# Patient Record
Sex: Male | Born: 1995 | Race: Black or African American | Hispanic: No | Marital: Single | Smoking: Current every day smoker
Health system: Southern US, Community
[De-identification: ages and names within clinical notes are randomized; demographics above are authoritative.]

## PROBLEM LIST (undated history)

## (undated) DIAGNOSIS — J45909 Unspecified asthma, uncomplicated: Secondary | ICD-10-CM

## (undated) HISTORY — PX: TONSILLECTOMY: SUR1361

---
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
## 2017-01-07 ENCOUNTER — Emergency Department
Admission: EM | Admit: 2017-01-07 | Discharge: 2017-01-07 | Disposition: A | Discharge status: Home / Self Care | Payer: Medicaid Other | Attending: Emergency Medicine | Admitting: Emergency Medicine

## 2017-01-07 ENCOUNTER — Encounter: Payer: Self-pay | Admitting: Medical Oncology

## 2017-01-07 DIAGNOSIS — Y939 Activity, unspecified: Secondary | ICD-10-CM | POA: Insufficient documentation

## 2017-01-07 DIAGNOSIS — J45909 Unspecified asthma, uncomplicated: Secondary | ICD-10-CM | POA: Insufficient documentation

## 2017-01-07 DIAGNOSIS — F17210 Nicotine dependence, cigarettes, uncomplicated: Secondary | ICD-10-CM | POA: Insufficient documentation

## 2017-01-07 DIAGNOSIS — Y929 Unspecified place or not applicable: Secondary | ICD-10-CM | POA: Insufficient documentation

## 2017-01-07 DIAGNOSIS — W25XXXA Contact with sharp glass, initial encounter: Secondary | ICD-10-CM | POA: Diagnosis not present

## 2017-01-07 DIAGNOSIS — S61215A Laceration without foreign body of left ring finger without damage to nail, initial encounter: Secondary | ICD-10-CM

## 2017-01-07 DIAGNOSIS — Y999 Unspecified external cause status: Secondary | ICD-10-CM | POA: Insufficient documentation

## 2017-01-07 DIAGNOSIS — S61216A Laceration without foreign body of right little finger without damage to nail, initial encounter: Secondary | ICD-10-CM | POA: Diagnosis not present

## 2017-01-07 DIAGNOSIS — Z23 Encounter for immunization: Secondary | ICD-10-CM | POA: Diagnosis not present

## 2017-01-07 MED ORDER — TETANUS-DIPHTH-ACELL PERTUSSIS 5-2.5-18.5 LF-MCG/0.5 IM SUSP
0.5000 mL | Freq: Once | INTRAMUSCULAR | Status: AC
  Administered 2017-01-07: 0.5 mL via INTRAMUSCULAR
  Filled 2017-01-07: qty 0.5

## 2017-01-07 NOTE — ED Provider Notes (Signed)
[facility] Emergency Department Provider Note   ____________________________________________   I have reviewed the triage vital signs and the nursing notes.   HISTORY  Chief Complaint Laceration    HPI Ronnie Garcia is a 21 y.o. male presents to the emergency department with a laceration along the lateral aspect of the right Ronnie Garcia finger. Patient reports lacerating the finger with a piece of broken glass earlier this evening. Patient reports the glass was not clean and he does not know when his last tetanus was updated. Patient is attempted to maintain hemorrhage control however during the laceration skin flap was completely lacerated from the finger edge. Patient reports intact movement and sensation of the right Ronnie Garcia finger. Patient denies fever, chills, headache, vision changes, chest pain, chest tightness, shortness of breath, abdominal pain, nausea and vomiting.  Past Medical History:  Diagnosis Date  . Asthma     There are no active problems to display for this patient.   Past Surgical History:  Procedure Laterality Date  . TONSILLECTOMY      Prior to Admission medications   Not on File    Allergies Patient has no known allergies.  No family history on file.  Social History Social History  Substance Use Topics  . Smoking status: Current Every Day Smoker  . Smokeless tobacco: Not on file  . Alcohol use Yes    Review of Systems Constitutional: Negative for fever/chills Eyes: No visual changes. ENT:  Negative for sore throat and for difficulty swallowing Cardiovascular: Denies chest pain. Respiratory: Denies cough. Denies shortness of breath. Musculoskeletal: Negative for back pain. Skin: Negative for rash. Laceration along the right [place] finger with complete removal of skin flap. Neurological: Negative for headaches.  ____________________________________________   PHYSICAL EXAM:  VITAL SIGNS: ED Triage Vitals  Enc  Vitals Group     BP 01/07/17 1845 112/68     Pulse Rate 01/07/17 1845 93     Resp 01/07/17 1845 16     Temp 01/07/17 1845 98.7 F (37.1 C)     Temp Source 01/07/17 1845 Oral     SpO2 01/07/17 1845 97 %     Weight 01/07/17 1843 167 lb (75.8 kg)     Height 01/07/17 1843 6\' 2"  (1.88 m)     Head Circumference --      Peak Flow --      Pain Score 01/07/17 1842 0     Pain Loc --      Pain Edu? --      Excl. in [place]? --     Constitutional: Alert and oriented. Well appearing and in no acute distress.  Eyes: Conjunctivae are normal. PERRL. EOMI  Head: Normocephalic and atraumatic. ENT:      Ears: Canals clear. TMs intact bilaterally.      Nose: No congestion/rhinnorhea.      Mouth/Throat: Mucous membranes are moist. Neck:Supple. No thyromegaly. No stridor.  Cardiovascular: Normal rate, regular rhythm. Respiratory: Normal respiratory effort without tachypnea or retractions. Lungs CTAB. No wheezes/rales/rhonchi. Hematological/Lymphatic/Immunological: No cervical lymphadenopathy. Cardiovascular: Normal rate, regular rhythm. Normal distal pulses. Gastrointestinal: Bowel sounds 4 quadrants. Soft and nontender to palpation. Musculoskeletal: Right Ronnie Garcia finger movement and sensation intact. Skin:  Skin is warm, dry and intact. No rash noted. Approximately 2 cm x 0.75 cm superficial laceration along the lateral aspect of the right Ronnie Garcia finger with skin flaps removed. Adipose tissue exposed with hemorrhage controlled. Psychiatric: Mood and affect are normal. Speech and behavior are normal. Patient exhibits  appropriate insight and judgement.  ____________________________________________   LABS (all labs ordered are listed, but only abnormal results are displayed)  Labs Reviewed - No data to display ____________________________________________  EKG None ____________________________________________  RADIOLOGY None ____________________________________________   PROCEDURES  Procedure(s)  performed: LACERATION REPAIR Performed by: Jerolyn Shin Authorized by: Jerolyn Shin Consent: Verbal consent obtained. Risks and benefits: risks, benefits and alternatives were discussed Consent given by: patient Patient identity confirmed: provided demographic data Prepped and Draped in normal sterile fashion Wound explored  Laceration Location: Lateral aspect of the right Ronnie Garcia finger.  Laceration Length: 2.0 cm x 0.75 cm superficial  No Foreign Bodies seen or palpated  Irrigation method: Normal saline with gauze  Amount of cleaning: standard  Skin closure: Surgicel placed in the wound bed then dressed with 2 x 2 and covered with rolled gauze.  Patient tolerance: Patient tolerated the procedure well with no immediate complications.   Critical Care performed: no ____________________________________________   INITIAL IMPRESSION / ASSESSMENT AND PLAN / ED COURSE  Pertinent labs & imaging results that were available during my care of the patient were reviewed by me and considered in my medical decision making (see chart for details).   Patient sustained a laceration to the lateral aspect of the right Ronnie Garcia finger.  Assessment confirmed movement and sensation of the digit before and after wound closure. Laceration required closure as noted above. Patient tolerated procedure well. Pt instructed to keep wound clean, dry and covered in dirty, dusty or wet environments. Patient also instructed to watch for signs of infection and return if changes are noted. Patient informed of clinical course, understand medical decision-making process, and agree with plan.  Patient was advised to follow up with PCP as needed and was also advised to return to the emergency department for symptoms that change or worsen.       ____________________________________________   FINAL CLINICAL IMPRESSION(S) / ED DIAGNOSES  Final diagnoses:  Laceration of left ring finger without foreign body  without damage to nail, initial encounter       NEW MEDICATIONS STARTED DURING THIS VISIT:  New Prescriptions   No medications on file     Note:  This document was prepared using Dragon voice recognition software and may include unintentional dictation errors.    Jerolyn Shin, PA-C 01/07/17 2015    Schuyler Amor, MD 01/07/17 [phone]

## 2017-01-07 NOTE — ED Notes (Signed)
Pt. Verbalizes understanding of d/c instructions and follow-up. VS stable and pain controlled per pt.  Pt. In NAD at time of d/c and denies further concerns regarding this visit. Pt. Stable at the time of departure from the unit, departing unit by the safest and most appropriate manner per that pt condition and limitations. Pt advised to return to the ED at any time for emergent concerns, or for new/worsening symptoms.   

## 2017-01-07 NOTE — ED Triage Notes (Signed)
Pt reports cutting rt hand pinky finger with broken glass. Tetanus is NOT UTD.

## 2017-01-07 NOTE — Discharge Instructions (Signed)
Keep the wound clean and dry and covered when and dirty dusty or wet environment. Do not remove the wound care material in the base of the wound allow it to fall off naturally.  Continue to monitor the wound area for signs of infection. If he noticed developing infection do not hesitate to return to the emergency department for continued care.

## 2017-09-12 ENCOUNTER — Other Ambulatory Visit: Payer: Self-pay

## 2017-09-12 ENCOUNTER — Emergency Department
Admission: EM | Admit: 2017-09-12 | Discharge: 2017-09-12 | Disposition: A | Discharge status: Home / Self Care | Payer: Medicaid Other | Attending: Emergency Medicine | Admitting: Emergency Medicine

## 2017-09-12 DIAGNOSIS — R369 Urethral discharge, unspecified: Secondary | ICD-10-CM

## 2017-09-12 DIAGNOSIS — A549 Gonococcal infection, unspecified: Secondary | ICD-10-CM | POA: Insufficient documentation

## 2017-09-12 DIAGNOSIS — F17200 Nicotine dependence, unspecified, uncomplicated: Secondary | ICD-10-CM | POA: Insufficient documentation

## 2017-09-12 DIAGNOSIS — J45909 Unspecified asthma, uncomplicated: Secondary | ICD-10-CM | POA: Insufficient documentation

## 2017-09-12 LAB — URINALYSIS, COMPLETE (UACMP) WITH MICROSCOPIC
Bacteria, UA: NONE SEEN
Bilirubin Urine: NEGATIVE
GLUCOSE, UA: NEGATIVE mg/dL
Hgb urine dipstick: NEGATIVE
Ketones, ur: NEGATIVE mg/dL
Nitrite: NEGATIVE
PROTEIN: NEGATIVE mg/dL
Specific Gravity, Urine: 1.02 (ref 1.005–1.030)
WBC, UA: >50 WBC/hpf — ABNORMAL HIGH (ref 0–5)
pH: 7 (ref 5.0–8.0)

## 2017-09-12 LAB — CHLAMYDIA/NGC RT PCR (ARMC ONLY)
CHLAMYDIA TR: NOT DETECTED
N gonorrhoeae: DETECTED — AB

## 2017-09-12 MED ORDER — AZITHROMYCIN 500 MG PO TABS
1000.0000 mg | ORAL_TABLET | ORAL | Status: AC
  Administered 2017-09-12: 1000 mg via ORAL
  Filled 2017-09-12: qty 2

## 2017-09-12 MED ORDER — LIDOCAINE HCL (PF) 1 % IJ SOLN
INTRAMUSCULAR | Status: AC
  Administered 2017-09-12: 5 mL
  Filled 2017-09-12: qty 5

## 2017-09-12 MED ORDER — CEFTRIAXONE SODIUM 250 MG IJ SOLR
250.0000 mg | Freq: Once | INTRAMUSCULAR | Status: AC
  Administered 2017-09-12: 250 mg via INTRAMUSCULAR
  Filled 2017-09-12: qty 250

## 2017-09-12 NOTE — ED Triage Notes (Signed)
Pain with urination, swelling to penis, bloody d/c.

## 2017-09-12 NOTE — ED Notes (Signed)
Pt reports pain during urination, describes purulent discharge from penis for the last 2 days, pt with significant other, reports only one sexual partner, denies hx of STI, no med or surgical hx

## 2017-09-12 NOTE — ED Provider Notes (Signed)
[facility] Emergency Department Provider Note  ____________________________________________   First MD Initiated Contact with Patient 09/12/17 [phone]     (approximate)  I have reviewed the triage vital signs and the nursing notes.   HISTORY  Chief Complaint Exposure to STD    HPI Ronnie Garcia is a 22 y.o. male with no past medical history presents for evaluation of about 24 hours of thick white penile discharge that is painful.  He reports that it is painful when he urinates as well.  He had no symptoms previously so this was acute in onset and he describes it as severe.  He admits to multiple sexual partners.  He presents tonight as well as his current girlfriend who is pregnant.  He is worried about having a sexual transmitted disease that he could have also given to her.  He denies any other symptoms, specifically including fever/chills, chest pain, shortness of breath, nausea, vomiting, abdominal pain.  Past Medical History:  Diagnosis Date  . Asthma     There are no active problems to display for this patient.   Past Surgical History:  Procedure Laterality Date  . TONSILLECTOMY      Prior to Admission medications   Not on File    Allergies Patient has no known allergies.  No family history on file.  Social History Social History   Tobacco Use  . Smoking status: Current Every Day Smoker  . Smokeless tobacco: Never Used  Substance Use Topics  . Alcohol use: Yes  . Drug use: Yes    Types: Marijuana    Review of Systems Constitutional: No fever/chills Cardiovascular: Denies chest pain. Respiratory: Denies shortness of breath. Gastrointestinal: No abdominal pain.  No nausea, no vomiting.   Genitourinary: Positive for penile discharge and dysuria Integumentary: Negative for rash.  ____________________________________________   PHYSICAL EXAM:  VITAL SIGNS: ED Triage Vitals  Enc Vitals Group     BP 09/12/17 0024 116/75   Pulse Rate 09/12/17 0024 74     Resp 09/12/17 0024 18     Temp 09/12/17 0024 98.4 F (36.9 C)     Temp Source 09/12/17 0024 Oral     SpO2 09/12/17 0024 97 %     Weight 09/12/17 0025 76.7 kg (169 lb)     Height 09/12/17 0025 1.88 m (6\' 2" )     Head Circumference --      Peak Flow --      Pain Score 09/12/17 0025 2     Pain Loc --      Pain Edu? --      Excl. in [place]? --     Constitutional: Alert and oriented. Well appearing and in no acute distress. Eyes: Conjunctivae are normal.  Head: Atraumatic. Cardiovascular: Normal rate, regular rhythm. Good peripheral circulation.  Respiratory: Normal respiratory effort.  No retractions.  Gastrointestinal: Soft and nontender. No distention.  Genitourinary: Normal external male genitalia.  No tenderness to palpation of the penis and no penile lesions.  Purulent discharge from the urethra.  No tenderness to palpation of the testicles or scrotum.  No scrotal edema. Neurologic:  Normal speech and language. No gross focal neurologic deficits are appreciated.  Skin:  Skin is warm, dry and intact. No rash noted. Psychiatric: Mood and affect are normal. Speech and behavior are normal.  ____________________________________________   LABS (all labs ordered are listed, but only abnormal results are displayed)  Labs Reviewed  CHLAMYDIA/NGC RT PCR ([facility] ONLY) - Abnormal; Notable for the  following components:      Result Value   N gonorrhoeae DETECTED (*)    All other components within normal limits  URINALYSIS, COMPLETE (UACMP) WITH MICROSCOPIC - Abnormal; Notable for the following components:   Color, Urine YELLOW (*)    APPearance CLOUDY (*)    Leukocytes, UA LARGE (*)    WBC, UA >50 (*)    All other components within normal limits  URINE CULTURE   ____________________________________________  EKG  No indication for EKG ____________________________________________  RADIOLOGY   ED MD interpretation: No indication for  imaging  Official radiology report(s): No results found.  ____________________________________________   PROCEDURES  Critical Care performed: No   Procedure(s) performed:   Procedures   ____________________________________________   INITIAL IMPRESSION / ASSESSMENT AND PLAN / ED COURSE  As part of my medical decision making, I reviewed the following data within the [facility] notes reviewed and incorporated and Labs reviewed     The patient's urinalysis is positive but likely as a result of his positive gonorrhea test.  I ordered a urine culture and I am treating with ceftriaxone 250 mg intramuscular and azithromycin 1 g by mouth.  His girlfriend is also being tested and if she test positive for trichomoniasis I will treat him as well.  Clinical Course as of Sep 13 355  Sat Sep 12, 2017  0356 The patient's partner was negative for trichomoniasis.  I will discharge him after he receives his ceftriaxone and azithromycin.  I gave my usual and customary return precautions.    [CF]    Clinical Course User Index [CF] Hinda Kehr, MD    ____________________________________________  FINAL CLINICAL IMPRESSION(S) / ED DIAGNOSES  Final diagnoses:  Gonorrhea  Penile discharge     MEDICATIONS GIVEN DURING THIS VISIT:  Medications  cefTRIAXone (ROCEPHIN) injection 250 mg (has no administration in time range)  azithromycin (ZITHROMAX) tablet 1,000 mg (has no administration in time range)     ED Discharge Orders    None       Note:  This document was prepared using Dragon voice recognition software and may include unintentional dictation errors.    Hinda Kehr, MD 09/12/17 [phone]

## 2017-09-12 NOTE — Discharge Instructions (Signed)
You have been seen today in the Emergency Department (ED) for an STD and found to have gonorrhea.  You have been treated with a one time dose medication which should cure your infection, but please remember you can contract the same (or different) STDs again whenever you have unprotected sex.  Please follow up with your doctor as soon as possible regarding today?s ED visit and your symptoms.   Return to the ED if your pain worsens, you develop a fever, or for any other symptoms that concern you.

## 2017-09-12 NOTE — ED Notes (Addendum)
Pt presents to ED from home with c/o pain and burning with urination x2 days and requesting to be checked for STIs. No fever, no c/o N/V/D.

## 2017-09-13 LAB — URINE CULTURE
Culture: NO GROWTH
Special Requests: NORMAL

## 2017-10-10 ENCOUNTER — Encounter: Payer: Self-pay | Admitting: Emergency Medicine

## 2017-10-10 ENCOUNTER — Emergency Department: Payer: Self-pay

## 2017-10-10 ENCOUNTER — Other Ambulatory Visit: Payer: Self-pay

## 2017-10-10 ENCOUNTER — Emergency Department
Admission: EM | Admit: 2017-10-10 | Discharge: 2017-10-10 | Disposition: A | Discharge status: Home / Self Care | Payer: Self-pay | Attending: Emergency Medicine | Admitting: Emergency Medicine

## 2017-10-10 DIAGNOSIS — W010XXA Fall on same level from slipping, tripping and stumbling without subsequent striking against object, initial encounter: Secondary | ICD-10-CM | POA: Insufficient documentation

## 2017-10-10 DIAGNOSIS — Y999 Unspecified external cause status: Secondary | ICD-10-CM | POA: Insufficient documentation

## 2017-10-10 DIAGNOSIS — Y929 Unspecified place or not applicable: Secondary | ICD-10-CM | POA: Insufficient documentation

## 2017-10-10 DIAGNOSIS — Y9367 Activity, basketball: Secondary | ICD-10-CM | POA: Insufficient documentation

## 2017-10-10 DIAGNOSIS — S52121A Displaced fracture of head of right radius, initial encounter for closed fracture: Secondary | ICD-10-CM | POA: Insufficient documentation

## 2017-10-10 DIAGNOSIS — J45909 Unspecified asthma, uncomplicated: Secondary | ICD-10-CM | POA: Insufficient documentation

## 2017-10-10 DIAGNOSIS — F17210 Nicotine dependence, cigarettes, uncomplicated: Secondary | ICD-10-CM | POA: Insufficient documentation

## 2017-10-10 DIAGNOSIS — S63501A Unspecified sprain of right wrist, initial encounter: Secondary | ICD-10-CM | POA: Insufficient documentation

## 2017-10-10 MED ORDER — TRAMADOL HCL 50 MG PO TABS
50.0000 mg | ORAL_TABLET | Freq: Once | ORAL | Status: AC
  Administered 2017-10-10: 50 mg via ORAL
  Filled 2017-10-10: qty 1

## 2017-10-10 MED ORDER — NAPROXEN 500 MG PO TABS: 500.0000 mg | ORAL_TABLET | Freq: Two times a day (BID) | ORAL | Status: DC

## 2017-10-10 MED ORDER — TRAMADOL HCL 50 MG PO TABS: 50.0000 mg | ORAL_TABLET | Freq: Two times a day (BID) | ORAL | 0 refills | Status: DC | PRN

## 2017-10-10 MED ORDER — NAPROXEN 500 MG PO TABS
500.0000 mg | ORAL_TABLET | Freq: Once | ORAL | Status: AC
  Administered 2017-10-10: 500 mg via ORAL
  Filled 2017-10-10: qty 1

## 2017-10-10 NOTE — ED Triage Notes (Signed)
Wrist and elbow pain post coming down on R arm while playing basketball yesterday evening.

## 2017-10-10 NOTE — ED Provider Notes (Signed)
[facility] Emergency Department Provider Note   ____________________________________________   First MD Initiated Contact with Patient 10/10/17 1513     (approximate)  I have reviewed the triage vital signs and the nursing notes.   HISTORY  Chief Complaint Wrist Pain and Elbow Pain    HPI Ronnie Garcia is a 22 y.o. male patient complaining of right elbow wrist pain secondary to fall yesterday while playing basketball.  Patient state elbow pain increases with extension of the upper extremity.  Patient stated wrist pain increased with flexion and extension of the wrist.  Patient rates pain as a 2/10.  Patient described the pain is "aching".  No palliative measures  for complaint.  Patient is right-hand dominant.  Past Medical History:  Diagnosis Date  . Asthma     There are no active problems to display for this patient.   Past Surgical History:  Procedure Laterality Date  . TONSILLECTOMY      Prior to Admission medications   Medication Sig Start Date End Date Taking? Authorizing Provider  naproxen (NAPROSYN) 500 MG tablet Take 1 tablet (500 mg total) by mouth 2 (two) times daily with a meal. 10/10/17   Sable Feil, PA-C  traMADol (ULTRAM) 50 MG tablet Take 1 tablet (50 mg total) by mouth every 12 (twelve) hours as needed. 10/10/17   Sable Feil, PA-C    Allergies Patient has no known allergies.  No family history on file.  Social History Social History   Tobacco Use  . Smoking status: Current Every Day Smoker  . Smokeless tobacco: Never Used  Substance Use Topics  . Alcohol use: Yes  . Drug use: Yes    Types: Marijuana    Review of Systems Constitutional: No fever/chills Eyes: No visual changes. ENT: No sore throat. Cardiovascular: Denies chest pain. Respiratory: Denies shortness of breath. Gastrointestinal: No abdominal pain.  No nausea, no vomiting.  No diarrhea.  No constipation. Genitourinary: Negative for  dysuria. Musculoskeletal: Right elbow and wrist pain.   Skin: Negative for rash. Neurological: Negative for headaches, focal weakness or numbness.   ____________________________________________   PHYSICAL EXAM:  VITAL SIGNS: ED Triage Vitals [10/10/17 1509]  Enc Vitals Group     BP 120/68     Pulse Rate (!) 110     Resp 20     Temp 98.1 F (36.7 C)     Temp src      SpO2 100 %     Weight 170 lb (77.1 kg)     Height 6\' 2"  (1.88 m)     Head Circumference      Peak Flow      Pain Score 0     Pain Loc      Pain Edu?      Excl. in [place]?     Constitutional: Alert and oriented. Well appearing and in no acute distress. Cardiovascular: Normal rate, regular rhythm. Grossly normal heart sounds.  Tachycardic.  Good peripheral circulation. Respiratory: Normal respiratory effort.  No retractions. Lungs CTAB. Musculoskeletal: No obvious deformity to the right upper extremity.  No abrasion or ecchymosis.  Right elbow effusion.  Patient has moderate guarding palpation proximal and distal radius. Neurologic:  Normal speech and language. No gross focal neurologic deficits are appreciated. No gait instability. Skin:  Skin is warm, dry and intact. No rash noted. Psychiatric: Mood and affect are normal. Speech and behavior are normal.  ____________________________________________   LABS (all labs ordered are listed, but only  abnormal results are displayed)  Labs Reviewed - No data to display ____________________________________________  EKG   ____________________________________________  RADIOLOGY    Official radiology report(s): Dg Elbow Complete Right  Result Date: 10/10/2017 CLINICAL DATA:  Fall playing basketball, pain EXAM: RIGHT ELBOW - COMPLETE 3+ VIEW COMPARISON:  None. FINDINGS: There is a right elbow joint effusion. Suspect subtle nondisplaced radial head fracture with slight cortical disruption noted on 2 of the images. No additional acute bony abnormality. No  subluxation or dislocation. IMPRESSION: Findings concerning for nondisplaced radial head fracture. Associated joint effusion. Electronically Signed   By: Rolm Baptise M.D.   On: 10/10/2017 16:00   Dg Wrist Complete Right  Result Date: 10/10/2017 CLINICAL DATA:  Fall playing basketball.  Wrist and elbow pain. EXAM: RIGHT WRIST - COMPLETE 3+ VIEW COMPARISON:  None. FINDINGS: Posterior soft tissue swelling. No acute bony abnormality. Specifically, no fracture, subluxation, or dislocation. IMPRESSION: No acute bony abnormality. Electronically Signed   By: Rolm Baptise M.D.   On: 10/10/2017 16:01    ____________________________________________   PROCEDURES  Procedure(s) performed:   .Splint Application Date/Time: 2/40/9735 4:21 PM Performed by: Suszanne Conners, NT Authorized by: Sable Feil, PA-C   Consent:    Consent obtained:  Verbal   Consent given by:  Patient   Risks discussed:  Numbness, pain and swelling Pre-procedure details:    Sensation:  Normal Procedure details:    Laterality:  Right   Location:  Elbow   Elbow:  R elbow   Cast type:  Long arm   Supplies:  Ortho-Glass and cotton padding Post-procedure details:    Pain:  Unchanged   Sensation:  Normal   Patient tolerance of procedure:  Tolerated well, no immediate complications    Critical Care performed:   ____________________________________________   INITIAL IMPRESSION / ASSESSMENT AND PLAN / ED COURSE  As part of my medical decision making, I reviewed the following data within the electronic MEDICAL RECORD NUMBER    Right upper extremity pain secondary to nondisplaced fracture of the radial head and sprain wrist.  Discussed x-ray findings with patient.  Patient placed in a posterior elbow splint and given discharge care instruction.  Patient advised to follow orthopedics for definitive evaluation and treatment.      ____________________________________________   FINAL CLINICAL IMPRESSION(S) / ED  DIAGNOSES  Final diagnoses:  Closed displaced fracture of head of right radius, initial encounter  Sprain of right wrist, initial encounter     ED Discharge Orders        Ordered    traMADol (ULTRAM) 50 MG tablet  Every 12 hours PRN     10/10/17 1621    naproxen (NAPROSYN) 500 MG tablet  2 times daily with meals     10/10/17 1621       Note:  This document was prepared using Dragon voice recognition software and may include unintentional dictation errors.    Sable Feil, PA-C 10/10/17 1627    Nena Polio, MD 10/10/17 2052

## 2017-10-10 NOTE — ED Notes (Signed)
X-ray at bedside

## 2018-06-24 ENCOUNTER — Emergency Department: Payer: Self-pay

## 2018-06-24 ENCOUNTER — Encounter: Payer: Self-pay | Admitting: Emergency Medicine

## 2018-06-24 ENCOUNTER — Other Ambulatory Visit: Payer: Self-pay

## 2018-06-24 ENCOUNTER — Inpatient Hospital Stay
Admission: EM | Admit: 2018-06-24 | Discharge: 2018-06-25 | DRG: 581 | Disposition: A | Discharge status: Home / Self Care | Payer: Self-pay | Attending: Internal Medicine | Admitting: Internal Medicine

## 2018-06-24 DIAGNOSIS — L731 Pseudofolliculitis barbae: Secondary | ICD-10-CM | POA: Diagnosis present

## 2018-06-24 DIAGNOSIS — L03221 Cellulitis of neck: Principal | ICD-10-CM | POA: Diagnosis present

## 2018-06-24 DIAGNOSIS — F17200 Nicotine dependence, unspecified, uncomplicated: Secondary | ICD-10-CM | POA: Diagnosis present

## 2018-06-24 DIAGNOSIS — L0211 Cutaneous abscess of neck: Secondary | ICD-10-CM | POA: Diagnosis present

## 2018-06-24 DIAGNOSIS — Z79899 Other long term (current) drug therapy: Secondary | ICD-10-CM

## 2018-06-24 DIAGNOSIS — J4520 Mild intermittent asthma, uncomplicated: Secondary | ICD-10-CM | POA: Diagnosis present

## 2018-06-24 DIAGNOSIS — D179 Benign lipomatous neoplasm, unspecified: Secondary | ICD-10-CM | POA: Diagnosis present

## 2018-06-24 LAB — COMPREHENSIVE METABOLIC PANEL
ALK PHOS: 154 U/L — AB (ref 38–126)
ALT: 14 U/L (ref 0–44)
AST: 18 U/L (ref 15–41)
Albumin: 4.6 g/dL (ref 3.5–5.0)
Anion gap: 10 (ref 5–15)
BILIRUBIN TOTAL: 1 mg/dL (ref 0.3–1.2)
BUN: 13 mg/dL (ref 6–20)
CALCIUM: 9.5 mg/dL (ref 8.9–10.3)
CHLORIDE: 100 mmol/L (ref 98–111)
CO2: 26 mmol/L (ref 22–32)
CREATININE: 0.87 mg/dL (ref 0.61–1.24)
GFR calc Af Amer: >60 mL/min (ref >60)
Glucose, Bld: 114 mg/dL — ABNORMAL HIGH (ref 70–99)
Potassium: 3.5 mmol/L (ref 3.5–5.1)
Sodium: 136 mmol/L (ref 135–145)
Total Protein: 8.8 g/dL — ABNORMAL HIGH (ref 6.5–8.1)

## 2018-06-24 LAB — CBC
HCT: 45 % (ref 39.0–52.0)
Hemoglobin: 14.3 g/dL (ref 13.0–17.0)
MCH: 26.9 pg (ref 26.0–34.0)
MCHC: 31.8 g/dL (ref 30.0–36.0)
MCV: 84.6 fL (ref 80.0–100.0)
NRBC: 0 % (ref 0.0–0.2)
Platelets: 243 10*3/uL (ref 150–400)
RBC: 5.32 MIL/uL (ref 4.22–5.81)
RDW: 13.2 % (ref 11.5–15.5)
WBC: 11.3 10*3/uL — ABNORMAL HIGH (ref 4.0–10.5)

## 2018-06-24 LAB — LACTIC ACID, PLASMA: LACTIC ACID, VENOUS: 1.1 mmol/L (ref 0.5–1.9)

## 2018-06-24 MED ORDER — ACETAMINOPHEN 650 MG RE SUPP
650.0000 mg | Freq: Four times a day (QID) | RECTAL | Status: DC | PRN
  Filled 2018-06-24: qty 1

## 2018-06-24 MED ORDER — SODIUM CHLORIDE 0.9 % IV SOLN
INTRAVENOUS | Status: DC | PRN
  Administered 2018-06-24: 5 mL via INTRAVENOUS

## 2018-06-24 MED ORDER — CLINDAMYCIN PHOSPHATE 900 MG/50ML IV SOLN: 900.0000 mg | Freq: Three times a day (TID) | INTRAVENOUS | Status: DC

## 2018-06-24 MED ORDER — OXYMETAZOLINE HCL 0.05 % NA SOLN
1.0000 | Freq: Once | NASAL | Status: AC
  Administered 2018-06-24: 1 via NASAL

## 2018-06-24 MED ORDER — PIPERACILLIN-TAZOBACTAM 3.375 G IVPB 30 MIN
3.3750 g | Freq: Once | INTRAVENOUS | Status: AC
  Administered 2018-06-24: 3.375 g via INTRAVENOUS
  Filled 2018-06-24: qty 50

## 2018-06-24 MED ORDER — ONDANSETRON HCL 4 MG PO TABS
4.0000 mg | ORAL_TABLET | Freq: Four times a day (QID) | ORAL | Status: DC | PRN
  Filled 2018-06-24: qty 1

## 2018-06-24 MED ORDER — ALBUTEROL SULFATE (2.5 MG/3ML) 0.083% IN NEBU: 2.5000 mg | INHALATION_SOLUTION | Freq: Four times a day (QID) | RESPIRATORY_TRACT | Status: DC | PRN

## 2018-06-24 MED ORDER — LIDOCAINE HCL URETHRAL/MUCOSAL 2 % EX GEL
1.0000 "application " | Freq: Once | CUTANEOUS | Status: AC
  Administered 2018-06-24: 1

## 2018-06-24 MED ORDER — CLINDAMYCIN PHOSPHATE 900 MG/50ML IV SOLN
900.0000 mg | Freq: Once | INTRAVENOUS | Status: AC
  Administered 2018-06-24: 900 mg via INTRAVENOUS
  Filled 2018-06-24: qty 50

## 2018-06-24 MED ORDER — POLYETHYLENE GLYCOL 3350 17 G PO PACK
17.0000 g | PACK | Freq: Every day | ORAL | Status: DC | PRN
  Filled 2018-06-24: qty 1

## 2018-06-24 MED ORDER — ONDANSETRON HCL 4 MG/2ML IJ SOLN: 4.0000 mg | Freq: Four times a day (QID) | INTRAMUSCULAR | Status: DC | PRN

## 2018-06-24 MED ORDER — LORATADINE 10 MG PO TABS
10.0000 mg | ORAL_TABLET | Freq: Every day | ORAL | Status: DC
  Administered 2018-06-24: 10 mg via ORAL
  Filled 2018-06-24: qty 1

## 2018-06-24 MED ORDER — KETOROLAC TROMETHAMINE 30 MG/ML IJ SOLN
30.0000 mg | Freq: Four times a day (QID) | INTRAMUSCULAR | Status: DC | PRN
  Administered 2018-06-24: 30 mg via INTRAVENOUS
  Filled 2018-06-24: qty 1

## 2018-06-24 MED ORDER — ACETAMINOPHEN 325 MG PO TABS
650.0000 mg | ORAL_TABLET | Freq: Four times a day (QID) | ORAL | Status: DC | PRN
  Filled 2018-06-24: qty 2

## 2018-06-24 MED ORDER — LIDOCAINE-EPINEPHRINE 2 %-1:100000 IJ SOLN
20.0000 mL | Freq: Once | INTRAMUSCULAR | Status: AC
  Administered 2018-06-24: 1 mL

## 2018-06-24 MED ORDER — CLINDAMYCIN PHOSPHATE 900 MG/50ML IV SOLN
900.0000 mg | Freq: Three times a day (TID) | INTRAVENOUS | Status: DC
  Administered 2018-06-24 – 2018-06-25 (×3): 900 mg via INTRAVENOUS
  Filled 2018-06-24 (×6): qty 50

## 2018-06-24 MED ORDER — ENOXAPARIN SODIUM 40 MG/0.4ML ~~LOC~~ SOLN
40.0000 mg | SUBCUTANEOUS | Status: DC
  Administered 2018-06-24: 40 mg via SUBCUTANEOUS
  Filled 2018-06-24: qty 0.4

## 2018-06-24 MED ORDER — IOHEXOL 300 MG/ML  SOLN
75.0000 mL | Freq: Once | INTRAMUSCULAR | Status: AC | PRN
  Administered 2018-06-24: 75 mL via INTRAVENOUS

## 2018-06-24 MED ORDER — ALBUTEROL SULFATE HFA 108 (90 BASE) MCG/ACT IN AERS: 2.0000 | INHALATION_SPRAY | RESPIRATORY_TRACT | Status: DC | PRN

## 2018-06-24 NOTE — Op Note (Signed)
..  06/24/2018  4:53 PM    [name]  [id number]   Pre-Op Dx:  Cellulitis of neck [E18.590], abscess  Post-op Dx: Cellulitis of neck [B31.121], abscess  Proc: 1)  Incision and drainage of anterior neck abcess  Surg:  Ronnie Garcia  Anes:  Local  EBL:  0  Comp:  0  Findings:  40ml of purulence expressed from wound and packed with iodoform gauze  Procedure: After the patient was identified in ER and verbal consent was obtained, the patient's anterior neck was prepped and draped in a normal fashion.  Iodine was applied to the anterior neck and 1.36ml of 2% lidocaine with 1:200,000 epinephrine was injected to the anterior neck.  An 11 blade scalpel was used to incised the skin overlying the fluctuance.  Purulence and necrotic material was expressed from the wound.  Straight hemostats were used to open the cavity and septations in the anterior neck.  Iodoform gauze was next packed into the wound with forceps.  This was dressed with a 2x2 gauze.  Care of the patient at this time was given back to the ER   Dispo:   To ER to be admitted for IV abx..  Plan:  Remove gauze tomorrow.  Most likely will not need repeat packing.  Ronnie Garcia  06/24/2018 4:53 PM

## 2018-06-24 NOTE — ED Provider Notes (Signed)
[facility] Emergency Department Provider Note    First MD Initiated Contact with Patient 06/24/18 [phone]     (approximate)  I have reviewed the triage vital signs and the nursing notes.   HISTORY  Chief Complaint Abscess    HPI Ronnie Garcia is a 23 y.o. male presents to the emergency department with "infected razor bump on the neck".  Patient states that he had an ingrown hair in that area which is significant other removed.  Area has subsequently increased in size and discomfort over the past day with associated difficulty swallowing.  Patient denies any fever afebrile on presentation.  Past Medical History:  Diagnosis Date  . Asthma       Past Surgical History:  Procedure Laterality Date  . TONSILLECTOMY      Prior to Admission medications   Medication Sig Start Date End Date Taking? Authorizing Provider  albuterol (PROVENTIL HFA;VENTOLIN HFA) 108 (90 Base) MCG/ACT inhaler Inhale 2 puffs into the lungs as needed for wheezing or shortness of breath.   Yes [provider]  cetirizine (ZYRTEC) 10 MG chewable tablet Chew 10 mg by mouth as needed for allergies.   Yes [provider]    Allergies Patient has no known allergies.  No family history on file.  Social History Social History   Tobacco Use  . Smoking status: Current Every Day Smoker  . Smokeless tobacco: Never Used  Substance Use Topics  . Alcohol use: Yes  . Drug use: Yes    Types: Marijuana    Review of Systems Constitutional: No fever/chills Eyes: No visual changes. ENT: No sore throat. Cardiovascular: Denies chest pain. Respiratory: Denies shortness of breath. Gastrointestinal: No abdominal pain.  No nausea, no vomiting.  No diarrhea.  No constipation. Genitourinary: Negative for dysuria. Musculoskeletal: Negative for neck pain.  Negative for back pain. Integumentary: Positive for "boil" on neck Neurological: Negative for headaches, focal weakness or  numbness.   ____________________________________________   PHYSICAL EXAM:  VITAL SIGNS: ED Triage Vitals  Enc Vitals Group     BP 06/24/18 0003 (!) 146/69     Pulse Rate 06/24/18 0003 91     Resp 06/24/18 0003 19     Temp 06/24/18 0003 99.2 F (37.3 C)     Temp Source 06/24/18 0003 Oral     SpO2 06/24/18 0003 99 %     Weight 06/24/18 0004 77.1 kg (170 lb)     Height 06/24/18 0004 1.88 m (6\' 2" )     Head Circumference --      Peak Flow --      Pain Score 06/24/18 0004 9     Pain Loc --      Pain Edu? --      Excl. in [place]? --     Constitutional: Alert and oriented. Well appearing and in no acute distress. Eyes: Conjunctivae are normal.  Mouth/Throat: Mucous membranes are moist.  Oropharynx non-erythematous. Neck: No stridor.  Anterior draining pustule noted at the level of the thyroid cartilage with surrounding induration.  No flocculence.  No palpable gas. Cardiovascular: Normal rate, regular rhythm. Good peripheral circulation. Grossly normal heart sounds. Respiratory: Normal respiratory effort.  No retractions. Lungs CTAB. Gastrointestinal: Soft and nontender. No distention.  Musculoskeletal: No lower extremity tenderness nor edema. No gross deformities of extremities. Neurologic:  Normal speech and language. No gross focal neurologic deficits are appreciated.  Skin: Draining pustule noted anterior neck at level of thyroid cartilage with 10 x 7  area of surrounding induration.  ____________________________________________   LABS (all labs ordered are listed, but only abnormal results are displayed)  Labs Reviewed  CBC - Abnormal; Notable for the following components:      Result Value   WBC 11.3 (*)    All other components within normal limits  COMPREHENSIVE METABOLIC PANEL - Abnormal; Notable for the following components:   Glucose, Bld 114 (*)    Total Protein 8.8 (*)    Alkaline Phosphatase 154 (*)    All other components within normal limits  CULTURE, BLOOD  (ROUTINE X 2)  CULTURE, BLOOD (ROUTINE X 2)  LACTIC ACID, PLASMA   ___________________________________  RADIOLOGY I, [place] N Adyline Huberty, personally viewed and evaluated these images (plain radiographs) as part of my medical decision making, as well as reviewing the written report by the radiologist.  ED MD interpretation: 11 x 19 mm right anterior neck superficial abscess extensive cellulitis with deep extension right strap muscle myositis per radiologist on interpretation of CT neck  Official radiology report(s): Ct Soft Tissue Neck W Contrast  Result Date: 06/24/2018 CLINICAL DATA:  Shaving lesion now infected.  Dysphagia. EXAM: CT NECK WITH CONTRAST TECHNIQUE: Multidetector CT imaging of the neck was performed using the standard protocol following the bolus administration of intravenous contrast. CONTRAST:  94mL OMNIPAQUE IOHEXOL 300 MG/ML  SOLN COMPARISON:  CT neck December 11, 2011 FINDINGS: PHARYNX AND LARYNX: Progressed base of tongue fatty infiltration and anterior pharynx. Normal pharynx and larynx. Patent airway. Widely patent airway. Stable asymmetric 1 cm focal fat RIGHT floor mouth, atypical appearance for ranula, possible small lipoma. SALIVARY GLANDS: Normal. THYROID: Normal. LYMPH NODES: No lymphadenopathy by CT size criteria. VASCULAR: Normal. LIMITED INTRACRANIAL: Normal. VISUALIZED ORBITS: Normal. MASTOIDS AND VISUALIZED PARANASAL SINUSES: Well-aerated. SKELETON: Nonacute.  No acute dental pathology. UPPER CHEST: Lung apices are clear. No superior mediastinal lymphadenopathy. OTHER: Irregular 11 x 19 x 27 mm rim enhancing fluid collection RIGHT parasagittal anterior upper neck extensive skin surface. Associated skin thickening, subcutaneous fat stranding. Thickened RIGHT platysma, RIGHT strap muscle. Edema extends subfascial into RIGHT > LEFT submandibular space. 4 mm superficial linear radiopaque foreign body (series 2, image 64) medial to the fluid collection. No subcutaneous gas or  radiopaque foreign bodies. IMPRESSION: 1. 11 x 19 x 27 mm RIGHT anterior neck superficial abscess. Extensive cellulitis with deep extension, RIGHT strap muscle myositis. 2. Increased mild tongue and anterior pharynx fatty infiltration. This could reflect lipomatous infiltration or atrophy. Consider non emergent ENT consultation. Electronically Signed   By: Elon Alas M.D.   On: 06/24/2018 01:34     Procedures   ____________________________________________   INITIAL IMPRESSION / MDM / ASSESSMENT AND PLAN / ED COURSE  As part of my medical decision making, I reviewed the following data within the electronic MEDICAL RECORD NUMBER  23 year old male presented with above-stated history and physical exam concerning for anterior neck abscess with cellulitis.  Concern concern for deep soft tissue infection and as such CT neck performed which revealed extensive cellulitis and myositis of the right strap muscles with associated superficial abscess.  Patient given IV Zosyn initially in the emergency department for scan performed and subsequently IV clindamycin.  Patient discussed with Dr. Brett Albino for hospital admission for further evaluation and management. ____________________________________________  FINAL CLINICAL IMPRESSION(S) / ED DIAGNOSES  Final diagnoses:  Cellulitis of neck     MEDICATIONS GIVEN DURING THIS VISIT:  Medications  piperacillin-tazobactam (ZOSYN) IVPB 3.375 g (0 g Intravenous Stopped 06/24/18 0209)  iohexol (OMNIPAQUE) 300  MG/ML solution 75 mL (75 mLs Intravenous Contrast Given 06/24/18 0106)  clindamycin (CLEOCIN) IVPB 900 mg (0 mg Intravenous Stopped 06/24/18 0247)     ED Discharge Orders    None       Note:  This document was prepared using Dragon voice recognition software and may include unintentional dictation errors.   Gregor Hams, MD 06/24/18 [phone]

## 2018-06-24 NOTE — H&P (Signed)
[place] [facility] [place] NAME: Ronnie Garcia    MR#:  [id number]  DATE OF BIRTH:  [birth year]  DATE OF ADMISSION:  06/24/2018  PRIMARY CARE PHYSICIAN: Patient, No Pcp Per   REQUESTING/REFERRING PHYSICIAN: Marjean Donna, MD  CHIEF COMPLAINT:   Chief Complaint  Patient presents with  . Abscess    HISTORY OF PRESENT ILLNESS:  Ronnie Garcia  is a 23 y.o. male with a known history of asthma who presented to the ED with swelling of his neck.  Two days ago, he noticed that he had an ingrown hair in the front of his neck after shaving.  He then noticed worsening infection over the next couple of days.  His neck became swollen and started draining white material.  He also endorses a sore throat.  No fevers or chills.  Mom has noticed that he is more diaphoretic than normal.  No shortness of breath or difficulty breathing.  In the ED, vitals were unremarkable.  Labs significant for WBC 11.3.  CT neck with 11 x 19 x 27 mm right anterior neck superficial abscess, extensive cellulitis with deep extension, and right strap muscle myositis.  He was given clindamycin and Zosyn.  Hospitalists were called for admission.  PAST MEDICAL HISTORY:   Past Medical History:  Diagnosis Date  . Asthma     PAST SURGICAL HISTORY:   Past Surgical History:  Procedure Laterality Date  . TONSILLECTOMY      SOCIAL HISTORY:   Social History   Tobacco Use  . Smoking status: Current Every Day Smoker  . Smokeless tobacco: Never Used  Substance Use Topics  . Alcohol use: Yes    FAMILY HISTORY:  Mother- hypertensio  DRUG ALLERGIES:  No Known Allergies  REVIEW OF SYSTEMS:   Review of Systems  Constitutional: Positive for diaphoresis. Negative for chills and fever.  HENT: Positive for sore throat. Negative for congestion.   Eyes: Negative for blurred vision and double vision.  Respiratory: Negative for cough, shortness of breath, wheezing and stridor.     Cardiovascular: Negative for chest pain and palpitations.  Gastrointestinal: Negative for abdominal pain, nausea and vomiting.  Genitourinary: Negative for dysuria and urgency.  Musculoskeletal: Positive for neck pain. Negative for back pain.  Neurological: Negative for dizziness and headaches.  Psychiatric/Behavioral: Negative for depression. The patient is not nervous/anxious.     MEDICATIONS AT HOME:   Prior to Admission medications   Medication Sig Start Date End Date Taking? Authorizing Provider  albuterol (PROVENTIL HFA;VENTOLIN HFA) 108 (90 Base) MCG/ACT inhaler Inhale 2 puffs into the lungs as needed for wheezing or shortness of breath.   Yes [provider]  cetirizine (ZYRTEC) 10 MG chewable tablet Chew 10 mg by mouth as needed for allergies.   Yes [provider]      VITAL SIGNS:  Blood pressure 133/74, pulse 92, temperature 99.2 F (37.3 C), temperature source Oral, resp. rate 17, height 6\' 2"  (1.88 m), weight 77.1 kg, SpO2 100 %.  PHYSICAL EXAMINATION:  Physical Exam GENERAL:  23 y.o. patient laying in the bed with no acute distress.  HEENT: Head atraumatic, normocephalic.  Pupils equal, round, reactive to light and accommodation. No scleral icterus. Extraocular muscles intact.  Oropharynx and nasopharynx clear. No trismus. NECK:  Supple, no jugular venous distention. Full ROM. +anterior skin lesion (see picture below). LUNGS: Normal breath sounds bilaterally, no wheezing, rales,rhonchi or crepitation. No use of accessory muscles of respiration.  CARDIOVASCULAR: RRR,  S1, S2 normal. No murmurs, rubs, or gallops.  ABDOMEN: Soft, nontender, nondistended. Bowel sounds present. No organomegaly or mass.  EXTREMITIES: No pedal edema, cyanosis, or clubbing.  NEUROLOGIC: Cranial nerves II through XII are intact. Muscle strength 5/5 in all extremities. Sensation intact. Gait not checked.  PSYCHIATRIC: The patient is alert and oriented x 3.  SKIN: No obvious  rash, lesion, or ulcer. +skin lesion of anterior neck     LABORATORY PANEL:   CBC Recent Labs  Lab 06/24/18 0023  WBC 11.3*  HGB 14.3  HCT 45.0  PLT 243   ------------------------------------------------------------------------------------------------------------------  Chemistries  Recent Labs  Lab 06/24/18 0023  NA 136  K 3.5  CL 100  CO2 26  GLUCOSE 114*  BUN 13  CREATININE 0.87  CALCIUM 9.5  AST 18  ALT 14  ALKPHOS 154*  BILITOT 1.0   ------------------------------------------------------------------------------------------------------------------  Cardiac Enzymes No results for input(s): TROPONINI in the last 168 hours. ------------------------------------------------------------------------------------------------------------------  RADIOLOGY:  Ct Soft Tissue Neck W Contrast  Result Date: 06/24/2018 CLINICAL DATA:  Shaving lesion now infected.  Dysphagia. EXAM: CT NECK WITH CONTRAST TECHNIQUE: Multidetector CT imaging of the neck was performed using the standard protocol following the bolus administration of intravenous contrast. CONTRAST:  74mL OMNIPAQUE IOHEXOL 300 MG/ML  SOLN COMPARISON:  CT neck December 11, 2011 FINDINGS: PHARYNX AND LARYNX: Progressed base of tongue fatty infiltration and anterior pharynx. Normal pharynx and larynx. Patent airway. Widely patent airway. Stable asymmetric 1 cm focal fat RIGHT floor mouth, atypical appearance for ranula, possible small lipoma. SALIVARY GLANDS: Normal. THYROID: Normal. LYMPH NODES: No lymphadenopathy by CT size criteria. VASCULAR: Normal. LIMITED INTRACRANIAL: Normal. VISUALIZED ORBITS: Normal. MASTOIDS AND VISUALIZED PARANASAL SINUSES: Well-aerated. SKELETON: Nonacute.  No acute dental pathology. UPPER CHEST: Lung apices are clear. No superior mediastinal lymphadenopathy. OTHER: Irregular 11 x 19 x 27 mm rim enhancing fluid collection RIGHT parasagittal anterior upper neck extensive skin surface. Associated skin  thickening, subcutaneous fat stranding. Thickened RIGHT platysma, RIGHT strap muscle. Edema extends subfascial into RIGHT > LEFT submandibular space. 4 mm superficial linear radiopaque foreign body (series 2, image 64) medial to the fluid collection. No subcutaneous gas or radiopaque foreign bodies. IMPRESSION: 1. 11 x 19 x 27 mm RIGHT anterior neck superficial abscess. Extensive cellulitis with deep extension, RIGHT strap muscle myositis. 2. Increased mild tongue and anterior pharynx fatty infiltration. This could reflect lipomatous infiltration or atrophy. Consider non emergent ENT consultation. Electronically Signed   By: Elon Alas M.D.   On: 06/24/2018 01:34      IMPRESSION AND PLAN:   Anterior neck cellulitis/abscess with myosititis- started as folliculitis. No signs of sepsis.  -Continue Clindamycin per pharm recs -ENT consult -Blood cultures pending -Tylenol and toradol for pain  Tongue and anterior pharynx fatty infiltration- seen on CT neck. No signs of airway obstruction. -ENT consult  Chronic asthma- stable, no signs of acute exacerbation -Albuterol prn  All the records are reviewed and case discussed with ED provider. Management plans discussed with the patient, family and they are in agreement.  CODE STATUS: Full  TOTAL TIME TAKING CARE OF THIS PATIENT: 45 minutes.    Berna Spare  M.D on 06/24/2018 at 3:15 AM  Between 7am to 6pm - Pager - [phone]  After 6pm go to [email or web address] - Proofreader  [facility]  Office  [phone]  CC: Primary care physician; Patient, No Pcp Per   Note: This dictation was prepared with Dragon dictation along with smaller phrase technology.  Any transcriptional errors that result from this process are unintentional.

## 2018-06-24 NOTE — Progress Notes (Signed)
Patient seen in ED ENT consult placed Agree with admitting MD plan

## 2018-06-24 NOTE — ED Notes (Signed)
ED TO INPATIENT HANDOFF REPORT  ED Nurse Name and Phone #: Iona Coach.,RN/ Kirke Shaggy W.,RN   S Name/Age/Gender Ronnie Garcia 23 y.o. male Room/Bed: ED37A/ED37A  Code Status   Code Status: Full Code  Home/SNF/Other Home Patient oriented to: self, place, time and situation Is this baseline? Yes   Triage Complete: Triage complete  Chief Complaint Absess  Triage Note Patient ambulatory to triage with steady gait, without difficulty or distress noted; pt reports "razor bump that got infected"; ulcer noted to neck with drainage and swelling around neck; having difficulty swallowing   Allergies No Known Allergies  Level of Care/Admitting Diagnosis ED Disposition    ED Disposition Condition [place]: Harold [[id number]]  Level of Care: Med-Surg [16]  Diagnosis: Cellulitis of neck [277824]  Admitting Physician: Hyman Bible DODD [[id number]]  Attending Physician: Hyman Bible DODD [[id number]]  Estimated length of stay: past midnight tomorrow  Certification:: I certify this patient will need inpatient services for at least 2 midnights  PT Class (Do Not Modify): Inpatient [101]  PT Acc Code (Do Not Modify): Private [1]       B Medical/Surgery History Past Medical History:  Diagnosis Date  . Asthma    Past Surgical History:  Procedure Laterality Date  . TONSILLECTOMY       A IV Location/Drains/Wounds Patient Lines/Drains/Airways Status   Active Line/Drains/Airways    Name:   Placement date:   Placement time:   Site:   Days:   Peripheral IV 06/24/18 Left Antecubital   06/24/18    0021    Antecubital   less than 1          Intake/Output Last 24 hours No intake or output data in the 24 hours ending 06/24/18 1507  Labs/Imaging Results for orders placed or performed during the hospital encounter of 06/24/18 (from the past 48 hour(s))  CBC     Status: Abnormal   Collection Time: 06/24/18 12:23 AM  Result Value Ref Range   WBC 11.3  (H) 4.0 - 10.5 K/uL   RBC 5.32 4.22 - 5.81 MIL/uL   Hemoglobin 14.3 13.0 - 17.0 g/dL   HCT 45.0 39.0 - 52.0 %   MCV 84.6 80.0 - 100.0 fL   MCH 26.9 26.0 - 34.0 pg   MCHC 31.8 30.0 - 36.0 g/dL   RDW 13.2 11.5 - 15.5 %   Platelets 243 150 - 400 K/uL   nRBC 0.0 0.0 - 0.2 %    Comment: Performed at [facility], [address] [place], [place] [address]  Comprehensive metabolic panel     Status: Abnormal   Collection Time: 06/24/18 12:23 AM  Result Value Ref Range   Sodium 136 135 - 145 mmol/L   Potassium 3.5 3.5 - 5.1 mmol/L   Chloride 100 98 - 111 mmol/L   CO2 26 22 - 32 mmol/L   Glucose, Bld 114 (H) 70 - 99 mg/dL   BUN 13 6 - 20 mg/dL   Creatinine, Ser 0.87 0.61 - 1.24 mg/dL   Calcium 9.5 8.9 - 10.3 mg/dL   Total Protein 8.8 (H) 6.5 - 8.1 g/dL   Albumin 4.6 3.5 - 5.0 g/dL   AST 18 15 - 41 U/L   ALT 14 0 - 44 U/L   Alkaline Phosphatase 154 (H) 38 - 126 U/L   Total Bilirubin 1.0 0.3 - 1.2 mg/dL   GFR calc non Af Amer >60 >60 mL/min   GFR calc  Af Amer >60 >60 mL/min   Anion gap 10 5 - 15    Comment: Performed at [facility], [address] [place], [place] [address]  Blood culture (routine x 2)     Status: None (Preliminary result)   Collection Time: 06/24/18 12:23 AM  Result Value Ref Range   Specimen Description BLOOD LEFT ASSIST CONTROL    Special Requests      BOTTLES DRAWN AEROBIC AND ANAEROBIC Blood Culture adequate volume   Culture      NO GROWTH < 12 HOURS Performed at [facility], [address], [place], [place] [address]    Report Status PENDING   Lactic acid, plasma     Status: None   Collection Time: 06/24/18 12:23 AM  Result Value Ref Range   Lactic Acid, Venous 1.1 0.5 - 1.9 mmol/L    Comment: Performed at [facility], [address] [place], [place] [address]  Blood culture (routine x 2)     Status: None (Preliminary result)   Collection Time: 06/24/18 12:40 AM  Result Value Ref Range   Specimen Description  BLOOD BLOOD LEFT HAND    Special Requests      BOTTLES DRAWN AEROBIC AND ANAEROBIC Blood Culture adequate volume   Culture      NO GROWTH < 12 HOURS Performed at [facility], [address], [place], [place] [address]    Report Status PENDING    Ct Soft Tissue Neck W Contrast  Result Date: 06/24/2018 CLINICAL DATA:  Shaving lesion now infected.  Dysphagia. EXAM: CT NECK WITH CONTRAST TECHNIQUE: Multidetector CT imaging of the neck was performed using the standard protocol following the bolus administration of intravenous contrast. CONTRAST:  25mL OMNIPAQUE IOHEXOL 300 MG/ML  SOLN COMPARISON:  CT neck December 11, 2011 FINDINGS: PHARYNX AND LARYNX: Progressed base of tongue fatty infiltration and anterior pharynx. Normal pharynx and larynx. Patent airway. Widely patent airway. Stable asymmetric 1 cm focal fat RIGHT floor mouth, atypical appearance for ranula, possible small lipoma. SALIVARY GLANDS: Normal. THYROID: Normal. LYMPH NODES: No lymphadenopathy by CT size criteria. VASCULAR: Normal. LIMITED INTRACRANIAL: Normal. VISUALIZED ORBITS: Normal. MASTOIDS AND VISUALIZED PARANASAL SINUSES: Well-aerated. SKELETON: Nonacute.  No acute dental pathology. UPPER CHEST: Lung apices are clear. No superior mediastinal lymphadenopathy. OTHER: Irregular 11 x 19 x 27 mm rim enhancing fluid collection RIGHT parasagittal anterior upper neck extensive skin surface. Associated skin thickening, subcutaneous fat stranding. Thickened RIGHT platysma, RIGHT strap muscle. Edema extends subfascial into RIGHT > LEFT submandibular space. 4 mm superficial linear radiopaque foreign body (series 2, image 64) medial to the fluid collection. No subcutaneous gas or radiopaque foreign bodies. IMPRESSION: 1. 11 x 19 x 27 mm RIGHT anterior neck superficial abscess. Extensive cellulitis with deep extension, RIGHT strap muscle myositis. 2. Increased mild tongue and anterior pharynx fatty infiltration. This could reflect lipomatous  infiltration or atrophy. Consider non emergent ENT consultation. Electronically Signed   By: Elon Alas M.D.   On: 06/24/2018 01:34    Pending Labs Unresulted Labs (From admission, onward)    Start     Ordered   06/24/18 0746  HIV antibody (Routine Testing)  Once,   STAT     06/24/18 0745          Vitals/Pain Today's Vitals   06/24/18 0830 06/24/18 0900 06/24/18 1000 06/24/18 1430  BP: 109/75 105/73 130/69 119/72  Pulse: 63 65 (!) 54 65  Resp:    18  Temp:  TempSrc:      SpO2: 97% 98% 100% 100%  Weight:      Height:      PainSc:    0-No pain    Isolation Precautions No active isolations  Medications Medications  loratadine (CLARITIN) tablet 10 mg (10 mg Oral Given 06/24/18 1009)  enoxaparin (LOVENOX) injection 40 mg (40 mg Subcutaneous Given 06/24/18 0958)  acetaminophen (TYLENOL) tablet 650 mg (has no administration in time range)    Or  acetaminophen (TYLENOL) suppository 650 mg (has no administration in time range)  ketorolac (TORADOL) 30 MG/ML injection 30 mg (30 mg Intravenous Given 06/24/18 0755)  polyethylene glycol (MIRALAX / GLYCOLAX) packet 17 g (has no administration in time range)  ondansetron (ZOFRAN) tablet 4 mg (has no administration in time range)    Or  ondansetron (ZOFRAN) injection 4 mg (has no administration in time range)  clindamycin (CLEOCIN) IVPB 900 mg (0 mg Intravenous Stopped 06/24/18 1105)  albuterol (PROVENTIL) (2.5 MG/3ML) 0.083% nebulizer solution 2.5 mg (has no administration in time range)  piperacillin-tazobactam (ZOSYN) IVPB 3.375 g (0 g Intravenous Stopped 06/24/18 0209)  iohexol (OMNIPAQUE) 300 MG/ML solution 75 mL (75 mLs Intravenous Contrast Given 06/24/18 0106)  clindamycin (CLEOCIN) IVPB 900 mg (0 mg Intravenous Stopped 06/24/18 0247)  oxymetazoline (AFRIN) 0.05 % nasal spray 1 spray (1 spray Each Nare Given 06/24/18 0943)  lidocaine (XYLOCAINE) 2 % jelly 1 application (1 application Other Given 06/24/18 0944)   lidocaine-EPINEPHrine (XYLOCAINE W/EPI) 2 %-1:100000 (with pres) injection 20 mL (1 mL Other Given 06/24/18 0944)    Mobility walks Low fall risk   Focused Assessments HEENT assessment: pt has dry dressing to mid-trachea site    R Recommendations: See Admitting Provider Note  Report given to:   Additional Notes:

## 2018-06-24 NOTE — ED Notes (Signed)
MD at the bedside  

## 2018-06-24 NOTE — ED Triage Notes (Signed)
Patient ambulatory to triage with steady gait, without difficulty or distress noted; pt reports "razor bump that got infected"; ulcer noted to neck with drainage and swelling around neck; having difficulty swallowing

## 2018-06-24 NOTE — Consult Note (Signed)
Ronnie Garcia, [place] [id number] 11/09/1995 Ronnie Costa, MD  Reason for Consult: neck cellulitis, base of tongue abnormatliy  HPI: 23 y.o. male presented to ED with several day history of neck swelling.  Reports he initially thought he had an ingrown hair that he squeezed and got to go down.  Swelling would come back and eventually worsened to point it began to cause issues with pain and swallowing.  Presented to ER and underwent evaluation including CT which was compared to one several years ago.  Allergies: No Known Allergies  ROS: Review of systems normal other than 12 systems except per HPI.  PMH:  Past Medical History:  Diagnosis Date  . Asthma     FH: No family history on file.  SH:  Social History   Socioeconomic History  . Marital status: Single    Spouse name: Not on file  . Number of children: Not on file  . Years of education: Not on file  . Highest education level: Not on file  Occupational History  . Not on file  Social Needs  . Financial resource strain: Not on file  . Food insecurity:    Worry: Not on file    Inability: Not on file  . Transportation needs:    Medical: Not on file    Non-medical: Not on file  Tobacco Use  . Smoking status: Current Every Day Smoker  . Smokeless tobacco: Never Used  Substance and Sexual Activity  . Alcohol use: Yes  . Drug use: Yes    Types: Marijuana  . Sexual activity: Yes  Lifestyle  . Physical activity:    Days per week: Not on file    Minutes per session: Not on file  . Stress: Not on file  Relationships  . Social connections:    Talks on phone: Not on file    Gets together: Not on file    Attends religious service: Not on file    Active member of club or organization: Not on file    Attends meetings of clubs or organizations: Not on file    Relationship status: Not on file  . Intimate partner violence:    Fear of current or ex partner: Not on file    Emotionally abused: Not on file    Physically abused: Not on  file    Forced sexual activity: Not on file  Other Topics Concern  . Not on file  Social History Narrative  . Not on file    PSH:  Past Surgical History:  Procedure Laterality Date  . TONSILLECTOMY      Physical  Exam:  GEN-  Quiet male with limited talking, NAD sitting upright in bed NEURO-CN 2-12 grossly intact and symmetric. EARS-EAC/TMs normal BL.  NOSE-  Clear anteriorly with no mucopus or polyps OC/OP-Oral cavity, lips, gums, ororpharynx normal with no masses or lesions. RESP-  Unlabored CARD-  RRR NECK-  Anterior right flucuance and erythema and active draining from anterior neck cellulitis/abscess EXT- Skin warm and dry.  Procedure:  Trans-nasal flexible laryngoscopy-  After verbal consent obtained, the patient's nasal cavity was anesthetized with topical Xylocaine gel and Afrin.  Flexible laryngoscope was inserted into the patient's left nasal cavity.  This was advanced for visualization of the patient's nasopharynx, pharynx, and larynx.  This demonstrated some beige appearance of his right lateral and base of tongue but no definitive mass or lesion.  Airway widely patent.  CT scan reviewed-  Anterior neck cellulitis.  ?72mm foreign body.  Anterior superficial abscess.  ? Lipomatous change to base of tongue   A/P: Anterior neck cellulitis and abscess, lingual lipoma  Plan:  I&D of abscess.  Recommend following lingual lipoma with possible MRI in future to fully delineate size and location.   Ronnie Garcia 06/24/2018 4:45 PM

## 2018-06-25 MED ORDER — CLINDAMYCIN HCL 300 MG PO CAPS: 300.0000 mg | ORAL_CAPSULE | Freq: Three times a day (TID) | ORAL | 0 refills | Status: AC

## 2018-06-25 MED ORDER — CLINDAMYCIN HCL 150 MG PO CAPS
300.0000 mg | ORAL_CAPSULE | Freq: Three times a day (TID) | ORAL | Status: DC
  Filled 2018-06-25 (×3): qty 2

## 2018-06-25 NOTE — Discharge Summary (Signed)
[facility] [place] NAME: Ronnie Garcia    MR#:  [id number]  DATE OF BIRTH:  09-26-1995  DATE OF ADMISSION:  06/24/2018 ADMITTING PHYSICIAN: Sela Hua, MD  DATE OF DISCHARGE: 06/25/2018  PRIMARY CARE PHYSICIAN: Patient, No Pcp Per    ADMISSION DIAGNOSIS:  Cellulitis of neck [F63.846]  DISCHARGE DIAGNOSIS:  Active Problems:   Cellulitis of neck   SECONDARY DIAGNOSIS:   Past Medical History:  Diagnosis Date  . Asthma     HOSPITAL COURSE:   23 year old male with no past medical history who presented to the emergency room due to neck pain and found to have a superficial neck abscess with cellulitis.  1.  Neck abscess with cellulitis: Patient is a operative day #1 bedside I&D by ENT.  Packing has been removed. Cellulitis has improved.  Patient will be discharged on oral clindamycin.  He can follow-up with ENT in 2 weeks.  2.  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  Chronic mild intermittent asthma without signs of exacerbation DISCHARGE CONDITIONS AND DIET:   Stable for discharge on regular diet  CONSULTS OBTAINED:    DRUG ALLERGIES:  No Known Allergies  DISCHARGE MEDICATIONS:   Allergies as of 06/25/2018   No Known Allergies     Medication List    STOP taking these medications   albuterol 108 (90 Base) MCG/ACT inhaler Commonly known as:  PROVENTIL HFA;VENTOLIN HFA   cetirizine 10 MG chewable tablet Commonly known as:  ZYRTEC     TAKE these medications   clindamycin 300 MG capsule Commonly known as:  CLEOCIN Take 1 capsule (300 mg total) by mouth every 8 (eight) hours for 5 days.         Today   CHIEF COMPLAINT:   Patient doing better this morning.  No pain   VITAL SIGNS:  Blood pressure (!) 126/91, pulse 85, temperature 98.2 F (36.8 C), temperature source Oral, resp. rate 20, height 6\' 2"  (1.88 m), weight 75 kg, SpO2 100 %.   REVIEW OF SYSTEMS:  Review of Systems   Constitutional: Negative.  Negative for chills, fever and malaise/fatigue.  HENT: Negative.  Negative for ear discharge, ear pain, hearing loss, nosebleeds and sore throat.   Eyes: Negative.  Negative for blurred vision and pain.  Respiratory: Negative.  Negative for cough, hemoptysis, shortness of breath and wheezing.   Cardiovascular: Negative.  Negative for chest pain, palpitations and leg swelling.  Gastrointestinal: Negative.  Negative for abdominal pain, blood in stool, diarrhea, nausea and vomiting.  Genitourinary: Negative.  Negative for dysuria.  Musculoskeletal: Negative.  Negative for back pain.  Skin: Negative.   Neurological: Negative for dizziness, tremors, speech change, focal weakness, seizures and headaches.  Endo/Heme/Allergies: Negative.  Does not bruise/bleed easily.  Psychiatric/Behavioral: Negative.  Negative for depression, hallucinations and suicidal ideas.     PHYSICAL EXAMINATION:  GENERAL:  23 y.o.-year-old patient lying in the bed with no acute distress.  NECK:  Supple, no jugular venous distention. No thyroid enlargement, no tenderness.  LUNGS: Normal breath sounds bilaterally, no wheezing, rales,rhonchi  No use of accessory muscles of respiration.  CARDIOVASCULAR: S1, S2 normal. No murmurs, rubs, or gallops.  ABDOMEN: Soft, non-tender, non-distended. Bowel sounds present. No organomegaly or mass.  EXTREMITIES: No pedal edema, cyanosis, or clubbing.  PSYCHIATRIC: The patient is alert and oriented x 3.  SKIN: Improved erythema and edema.   DATA REVIEW:   CBC Recent Labs  Lab  06/24/18 0023  WBC 11.3*  HGB 14.3  HCT 45.0  PLT 243    Chemistries  Recent Labs  Lab 06/24/18 0023  NA 136  K 3.5  CL 100  CO2 26  GLUCOSE 114*  BUN 13  CREATININE 0.87  CALCIUM 9.5  AST 18  ALT 14  ALKPHOS 154*  BILITOT 1.0    Cardiac Enzymes No results for input(s): TROPONINI in the last 168 hours.  Microbiology Results  @[email or web address] @  RADIOLOGY:  Ct  Soft Tissue Neck W Contrast  Result Date: 06/24/2018 CLINICAL DATA:  Shaving lesion now infected.  Dysphagia. EXAM: CT NECK WITH CONTRAST TECHNIQUE: Multidetector CT imaging of the neck was performed using the standard protocol following the bolus administration of intravenous contrast. CONTRAST:  67mL OMNIPAQUE IOHEXOL 300 MG/ML  SOLN COMPARISON:  CT neck December 11, 2011 FINDINGS: PHARYNX AND LARYNX: Progressed base of tongue fatty infiltration and anterior pharynx. Normal pharynx and larynx. Patent airway. Widely patent airway. Stable asymmetric 1 cm focal fat RIGHT floor mouth, atypical appearance for ranula, possible small lipoma. SALIVARY GLANDS: Normal. THYROID: Normal. LYMPH NODES: No lymphadenopathy by CT size criteria. VASCULAR: Normal. LIMITED INTRACRANIAL: Normal. VISUALIZED ORBITS: Normal. MASTOIDS AND VISUALIZED PARANASAL SINUSES: Well-aerated. SKELETON: Nonacute.  No acute dental pathology. UPPER CHEST: Lung apices are clear. No superior mediastinal lymphadenopathy. OTHER: Irregular 11 x 19 x 27 mm rim enhancing fluid collection RIGHT parasagittal anterior upper neck extensive skin surface. Associated skin thickening, subcutaneous fat stranding. Thickened RIGHT platysma, RIGHT strap muscle. Edema extends subfascial into RIGHT > LEFT submandibular space. 4 mm superficial linear radiopaque foreign body (series 2, image 64) medial to the fluid collection. No subcutaneous gas or radiopaque foreign bodies. IMPRESSION: 1. 11 x 19 x 27 mm RIGHT anterior neck superficial abscess. Extensive cellulitis with deep extension, RIGHT strap muscle myositis. 2. Increased mild tongue and anterior pharynx fatty infiltration. This could reflect lipomatous infiltration or atrophy. Consider non emergent ENT consultation. Electronically Signed   By: Elon Alas M.D.   On: 06/24/2018 01:34      Allergies as of 06/25/2018   No Known Allergies     Medication List    STOP taking these medications   albuterol  108 (90 Base) MCG/ACT inhaler Commonly known as:  PROVENTIL HFA;VENTOLIN HFA   cetirizine 10 MG chewable tablet Commonly known as:  ZYRTEC     TAKE these medications   clindamycin 300 MG capsule Commonly known as:  CLEOCIN Take 1 capsule (300 mg total) by mouth every 8 (eight) hours for 5 days.         Management plans discussed with the patient and he is in agreement. Stable for discharge home  Patient should follow up with ent  CODE STATUS:     Code Status Orders  (From admission, onward)         Start     Ordered   06/24/18 0746  Full code  Continuous     06/24/18 0745        Code Status History    This patient has a current code status but no historical code status.      TOTAL TIME TAKING CARE OF THIS PATIENT: 38 minutes.    Note: This dictation was prepared with Dragon dictation along with smaller phrase technology. Any transcriptional errors that result from this process are unintentional.  Luise Yamamoto M.D on 06/25/2018 at 11:14 AM  Between 7am to 6pm - Pager - [phone] After 6pm go to [email or web address] - password [email or web address]  [facility]  Office  [phone]  CC: Primary care physician; Patient, No Pcp Per

## 2018-06-25 NOTE — Progress Notes (Signed)
..   06/25/2018 7:50 AM  Ronnie Garcia [id number]  Hospital Day 2    Temp:  [97.9 F (36.6 C)-98.1 F (36.7 C)] 98.1 F (36.7 C) (02/28 0430) Pulse Rate:  [54-68] 58 (02/28 0430) Resp:  [16-20] 16 (02/28 0430) BP: (105-130)/(69-78) 122/72 (02/28 0430) SpO2:  [97 %-100 %] 100 % (02/28 0430) Weight:  [75 kg] 75 kg (02/27 1552),     Intake/Output Summary (Last 24 hours) at 06/25/2018 0750 Last data filed at 06/25/2018 0319 Gross per 24 hour  Intake 112.12 ml  Output -  Net 112.12 ml    No results found for this or any previous visit (from the past 24 hour(s)).  SUBJECTIVE:  Improved exam per patient  OBJECTIVE:  GEN-  NAD, supine in bed NECK-  Packing removed, improved erythema and edema  IMPRESSION:  S/p I&D of superficial neck abscess with cellulitis  PLAN:  OK to discharge home later today on oral antibiotics per ENT perspective.  Ronnie Garcia 06/25/2018, 7:50 AM

## 2018-06-26 LAB — HIV ANTIBODY (ROUTINE TESTING W REFLEX): HIV Screen 4th Generation wRfx: NONREACTIVE

## 2018-06-29 LAB — CULTURE, BLOOD (ROUTINE X 2)
Culture: NO GROWTH
Culture: NO GROWTH
Special Requests: ADEQUATE
Special Requests: ADEQUATE

## 2018-11-22 ENCOUNTER — Ambulatory Visit: Payer: Medicaid Other

## 2019-04-12 ENCOUNTER — Emergency Department: Payer: Medicaid Other

## 2019-04-12 ENCOUNTER — Emergency Department
Admission: EM | Admit: 2019-04-12 | Discharge: 2019-04-12 | Disposition: A | Discharge status: Home / Self Care | Payer: Medicaid Other | Attending: Emergency Medicine | Admitting: Emergency Medicine

## 2019-04-12 ENCOUNTER — Other Ambulatory Visit: Payer: Self-pay

## 2019-04-12 DIAGNOSIS — S93402A Sprain of unspecified ligament of left ankle, initial encounter: Secondary | ICD-10-CM | POA: Insufficient documentation

## 2019-04-12 DIAGNOSIS — Y9367 Activity, basketball: Secondary | ICD-10-CM | POA: Insufficient documentation

## 2019-04-12 DIAGNOSIS — W010XXA Fall on same level from slipping, tripping and stumbling without subsequent striking against object, initial encounter: Secondary | ICD-10-CM | POA: Insufficient documentation

## 2019-04-12 DIAGNOSIS — J45909 Unspecified asthma, uncomplicated: Secondary | ICD-10-CM | POA: Insufficient documentation

## 2019-04-12 DIAGNOSIS — F1210 Cannabis abuse, uncomplicated: Secondary | ICD-10-CM | POA: Insufficient documentation

## 2019-04-12 DIAGNOSIS — Y92310 Basketball court as the place of occurrence of the external cause: Secondary | ICD-10-CM | POA: Insufficient documentation

## 2019-04-12 DIAGNOSIS — Y999 Unspecified external cause status: Secondary | ICD-10-CM | POA: Insufficient documentation

## 2019-04-12 DIAGNOSIS — F17210 Nicotine dependence, cigarettes, uncomplicated: Secondary | ICD-10-CM | POA: Insufficient documentation

## 2019-04-12 MED ORDER — MELOXICAM 15 MG PO TABS: 15.0000 mg | ORAL_TABLET | Freq: Every day | ORAL | 1 refills | Status: AC

## 2019-04-12 NOTE — ED Provider Notes (Signed)
Emergency Department Provider Note  ____________________________________________  Time seen: Approximately 11:12 PM  I have reviewed the triage vital signs and the nursing notes.   HISTORY  Chief Complaint Ankle Pain   Historian Patient     HPI Ronnie Garcia is a 23 y.o. male presents to the emergency department with acute left ankle pain for the past 2 days after patient sustained an inversion type ankle injury while playing basketball.  Patient states that he has been able to ambulate with some pain.  He denies any history of prior ankle sprains in the past.  No numbness or tingling of the bilateral lower extremities.  He did not hit his head or his neck during fall.  No other alleviating measures have been attempted.   Past Medical History:  Diagnosis Date  . Asthma      Immunizations up to date:  Yes.     Past Medical History:  Diagnosis Date  . Asthma     Patient Active Problem List   Diagnosis Date Noted  . Cellulitis of neck 06/24/2018    Past Surgical History:  Procedure Laterality Date  . TONSILLECTOMY      Prior to Admission medications   Medication Sig Start Date End Date Taking? Authorizing Provider  meloxicam (MOBIC) 15 MG tablet Take 1 tablet (15 mg total) by mouth daily for 7 days. 04/12/19 04/19/19  Lannie Fields, PA-C    Allergies Patient has no known allergies.  No family history on file.  Social History Social History   Tobacco Use  . Smoking status: Current Every Day Smoker  . Smokeless tobacco: Never Used  Substance Use Topics  . Alcohol use: Yes  . Drug use: Yes    Types: Marijuana     Review of Systems  Constitutional: No fever/chills Eyes:  No discharge ENT: No upper respiratory complaints. Respiratory: no cough. No SOB/ use of accessory muscles to breath Gastrointestinal:   No nausea, no vomiting.  No diarrhea.  No constipation. Musculoskeletal: Patient has left ankle pain.  Skin: Negative for rash, abrasions,  lacerations, ecchymosis.   ____________________________________________   PHYSICAL EXAM:  VITAL SIGNS: ED Triage Vitals  Enc Vitals Group     BP 04/12/19 2226 126/71     Pulse Rate 04/12/19 2226 79     Resp 04/12/19 2226 16     Temp 04/12/19 2226 98.3 F (36.8 C)     Temp Source 04/12/19 2226 Oral     SpO2 04/12/19 2226 100 %     Weight 04/12/19 2228 165 lb (74.8 kg)     Height 04/12/19 2228 6\' 2"  (1.88 m)     Head Circumference --      Peak Flow --      Pain Score 04/12/19 2228 8     Pain Loc --      Pain Edu? --      Excl. in [place]? --      Constitutional: Alert and oriented. Well appearing and in no acute distress. Eyes: Conjunctivae are normal. PERRL. EOMI. Head: Atraumatic. ENT: Cardiovascular: Normal rate, regular rhythm. Normal S1 and S2.  Good peripheral circulation. Respiratory: Normal respiratory effort without tachypnea or retractions. Lungs CTAB. Good air entry to the bases with no decreased or absent breath sounds Musculoskeletal: Patient has pain to palpation over the anterior talofibular ligament and deltoid ligament.  He performs limited range of motion at the left ankle, likely secondary to pain.  He is able to move all 5 left toes.  Palpable dorsalis pedis pulse, left. Neurologic:  Normal for age. No gross focal neurologic deficits are appreciated.  Skin:  Skin is warm, dry and intact. No rash noted. Psychiatric: Mood and affect are normal for age. Speech and behavior are normal.   ____________________________________________   LABS (all labs ordered are listed, but only abnormal results are displayed)  Labs Reviewed - No data to display ____________________________________________  EKG   ____________________________________________  RADIOLOGY Unk Pinto, personally viewed and evaluated these images (plain radiographs) as part of my medical decision making, as well as reviewing the written report by the radiologist.  DG Ankle Complete  Left  Result Date: 04/12/2019 CLINICAL DATA:  Left ankle pain after basketball injury 2 days ago. EXAM: LEFT ANKLE COMPLETE - 3+ VIEW COMPARISON:  None. FINDINGS: There is no evidence of fracture, dislocation, or joint effusion. Talar dome is intact. There is no evidence of arthropathy or other focal bone abnormality. Soft tissues are unremarkable. IMPRESSION: No acute fracture or subluxation of the ankle. Electronically Signed   By: Keith Rake M.D.   On: 04/12/2019 22:40    ____________________________________________    PROCEDURES  Procedure(s) performed:     Procedures     Medications - No data to display   ____________________________________________   INITIAL IMPRESSION / ASSESSMENT AND PLAN / ED COURSE  Pertinent labs & imaging results that were available during my care of the patient were reviewed by me and considered in my medical decision making (see chart for details).      Assessment and plan Left ankle pain 23 year old male presents to the emergency department with acute left ankle pain after sustaining an inversion type ankle injury.  X-ray examination reveals no bony abnormality.  An Ace wrap was applied and patient was discharged with meloxicam.  He was advised to follow-up with primary care as needed.  All patient questions were answered.   ____________________________________________  FINAL CLINICAL IMPRESSION(S) / ED DIAGNOSES  Final diagnoses:  Sprain of left ankle, unspecified ligament, initial encounter      NEW MEDICATIONS STARTED DURING THIS VISIT:  ED Discharge Orders         Ordered    meloxicam (MOBIC) 15 MG tablet  Daily     04/12/19 2310              This chart was dictated using voice recognition software/Dragon. Despite best efforts to proofread, errors can occur which can change the meaning. Any change was purely unintentional.     Lannie Fields, PA-C 04/12/19 2319    Nena Polio, MD 04/16/19 2248

## 2019-04-12 NOTE — ED Triage Notes (Signed)
Pt arrived via POV with a left ankle injury. Pt was playing basketball and landed on his ankle wrong. Pt in no distress.

## 2019-10-03 ENCOUNTER — Encounter: Payer: Self-pay | Admitting: Emergency Medicine

## 2019-10-03 ENCOUNTER — Emergency Department: Payer: Self-pay

## 2019-10-03 ENCOUNTER — Emergency Department
Admission: EM | Admit: 2019-10-03 | Discharge: 2019-10-03 | Disposition: A | Discharge status: Home / Self Care | Payer: Self-pay | Attending: Student | Admitting: Student

## 2019-10-03 ENCOUNTER — Other Ambulatory Visit: Payer: Self-pay

## 2019-10-03 DIAGNOSIS — Y939 Activity, unspecified: Secondary | ICD-10-CM | POA: Insufficient documentation

## 2019-10-03 DIAGNOSIS — W51XXXA Accidental striking against or bumped into by another person, initial encounter: Secondary | ICD-10-CM | POA: Insufficient documentation

## 2019-10-03 DIAGNOSIS — Z23 Encounter for immunization: Secondary | ICD-10-CM | POA: Insufficient documentation

## 2019-10-03 DIAGNOSIS — S01112A Laceration without foreign body of left eyelid and periocular area, initial encounter: Secondary | ICD-10-CM | POA: Insufficient documentation

## 2019-10-03 DIAGNOSIS — Y929 Unspecified place or not applicable: Secondary | ICD-10-CM | POA: Insufficient documentation

## 2019-10-03 DIAGNOSIS — Y999 Unspecified external cause status: Secondary | ICD-10-CM | POA: Insufficient documentation

## 2019-10-03 DIAGNOSIS — F17200 Nicotine dependence, unspecified, uncomplicated: Secondary | ICD-10-CM | POA: Insufficient documentation

## 2019-10-03 DIAGNOSIS — J45909 Unspecified asthma, uncomplicated: Secondary | ICD-10-CM | POA: Insufficient documentation

## 2019-10-03 MED ORDER — TETANUS-DIPHTH-ACELL PERTUSSIS 5-2.5-18.5 LF-MCG/0.5 IM SUSP
0.5000 mL | Freq: Once | INTRAMUSCULAR | Status: AC
  Administered 2019-10-03: 0.5 mL via INTRAMUSCULAR
  Filled 2019-10-03: qty 0.5
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
## 2020-09-03 ENCOUNTER — Emergency Department
Admission: EM | Admit: 2020-09-03 | Discharge: 2020-09-03 | Disposition: A | Discharge status: Home / Self Care | Payer: Medicaid Other | Attending: Emergency Medicine | Admitting: Emergency Medicine

## 2020-09-03 ENCOUNTER — Encounter: Payer: Self-pay | Admitting: Emergency Medicine

## 2020-09-03 DIAGNOSIS — W2105XA Struck by basketball, initial encounter: Secondary | ICD-10-CM | POA: Insufficient documentation

## 2020-09-03 DIAGNOSIS — F17200 Nicotine dependence, unspecified, uncomplicated: Secondary | ICD-10-CM | POA: Insufficient documentation

## 2020-09-03 DIAGNOSIS — S01511A Laceration without foreign body of lip, initial encounter: Secondary | ICD-10-CM | POA: Insufficient documentation

## 2020-09-03 DIAGNOSIS — Y9367 Activity, basketball: Secondary | ICD-10-CM | POA: Insufficient documentation

## 2020-09-03 DIAGNOSIS — Y92310 Basketball court as the place of occurrence of the external cause: Secondary | ICD-10-CM | POA: Insufficient documentation

## 2020-09-03 DIAGNOSIS — J45909 Unspecified asthma, uncomplicated: Secondary | ICD-10-CM | POA: Insufficient documentation

## 2020-09-03 MED ORDER — LIDOCAINE HCL (PF) 1 % IJ SOLN
5.0000 mL | Freq: Once | INTRAMUSCULAR | Status: AC
  Administered 2020-09-03: 5 mL
  Filled 2020-09-03: qty 5

## 2020-09-03 MED ORDER — LIDOCAINE-EPINEPHRINE-TETRACAINE (LET) SOLUTION
3.0000 mL | Freq: Once | NASAL | Status: AC
  Administered 2020-09-03: 3 mL via TOPICAL
  Filled 2020-09-03: qty 3
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
## 2020-09-03 NOTE — Discharge Instructions (Addendum)
Keep the wound clean and covered with a thin veil of petroleum jelly or antibiotic ointment.  See your provider or local urgent care center for suture removal in 3 to 5 days.

## 2020-09-03 NOTE — ED Triage Notes (Signed)
Pt hit with elbow while playing basketball 30 minutes ago and now has 1/2 inch laceration to the upper left side of lip. No bleeding noted.

## 2020-09-07 ENCOUNTER — Encounter: Payer: Self-pay | Admitting: Emergency Medicine

## 2020-09-07 ENCOUNTER — Ambulatory Visit
Admission: EM | Admit: 2020-09-07 | Discharge: 2020-09-07 | Disposition: A | Discharge status: Home / Self Care | Payer: Medicaid Other | Attending: Sports Medicine | Admitting: Sports Medicine

## 2020-09-07 ENCOUNTER — Other Ambulatory Visit: Payer: Self-pay

## 2020-09-07 DIAGNOSIS — Z4802 Encounter for removal of sutures: Secondary | ICD-10-CM

## 2020-09-07 DIAGNOSIS — S0993XD Unspecified injury of face, subsequent encounter: Secondary | ICD-10-CM

## 2020-09-07 DIAGNOSIS — S0993XA Unspecified injury of face, initial encounter: Secondary | ICD-10-CM

## 2020-09-07 NOTE — ED Triage Notes (Signed)
Pt needs sutures removed from his upper lip. Pt had four sutures place five days ago. Laceration is dry and patient doesn't complains of any pain.

## 2020-09-10 NOTE — Discharge Instructions (Signed)
Please see educational handouts.

## 2020-09-10 NOTE — ED Provider Notes (Signed)
[facility]    CSN: [id number] Arrival date & time: 09/07/20  1418      History   Chief Complaint Chief Complaint  Patient presents with  . Suture / Staple Removal    HPI Ronnie Garcia is a 25 y.o. male.   25 year old male who presents for evaluation of a laceration.  He sustained it to the left side of his upper lip on Sep 03, 2020.  Was seen and evaluated in [facility] ED.  Had 4 interrupted sutures placed.  No loss of consciousness or headache at that time.  He reports no problems with the sutures.  Was told to follow-up outside of the ER to have them removed and to make sure that the wound is healing appropriately and he had no complications.  He reports no issues with the wound.  No fever shakes chills.  No redness or warmth.  No red flag signs or symptoms elicited on history.     Past Medical History:  Diagnosis Date  . Asthma     Patient Active Problem List   Diagnosis Date Noted  . Cellulitis of neck 06/24/2018    Past Surgical History:  Procedure Laterality Date  . TONSILLECTOMY         Home Medications    Prior to Admission medications   Not on File    Family History History reviewed. No pertinent family history.  Social History Social History   Tobacco Use  . Smoking status: Current Every Day Smoker  . Smokeless tobacco: Never Used  Substance Use Topics  . Alcohol use: Yes  . Drug use: Yes    Types: Marijuana     Allergies   Patient has no known allergies.   Review of Systems Review of Systems  Constitutional: Negative for chills, diaphoresis and fever.  HENT: Negative for facial swelling, sinus pressure and sinus pain.   Eyes: Negative for pain.  Skin: Positive for wound. Negative for color change, pallor and rash.  Neurological: Negative for dizziness, light-headedness and headaches.  All other systems reviewed and are negative.    Physical Exam Triage Vital Signs ED Triage Vitals  Enc Vitals Group     BP 09/07/20  1438 127/73     Pulse Rate 09/07/20 1438 67     Resp 09/07/20 1438 17     Temp --      Temp Source 09/07/20 1438 Oral     SpO2 09/07/20 1438 98 %     Weight --      Height --      Head Circumference --      Peak Flow --      Pain Score 09/07/20 1437 0     Pain Loc --      Pain Edu? --      Excl. in [place]? --    No data found.  Updated Vital Signs BP 127/73 (BP Location: Left Arm)   Pulse 67   Resp 17   SpO2 98%   Visual Acuity Right Eye Distance:   Left Eye Distance:   Bilateral Distance:    Right Eye Near:   Left Eye Near:    Bilateral Near:     Physical Exam Vitals and nursing note reviewed.  HENT:     Head: Normocephalic and atraumatic.     Nose: Nose normal.     Mouth/Throat:     Mouth: Mucous membranes are moist.     Pharynx: No oropharyngeal exudate or posterior oropharyngeal erythema.  Comments: Upper lip with a 1 cm laceration crossing the vermilion border.  No active bleeding is appreciated.  Wound is healing quite well.  There are 4 interrupted sutures visualized.  They were removed without incident.  No evidence of infection or abscess.  No erythema.  No dental injury is noted. Eyes:     General: No scleral icterus.       Right eye: No discharge.        Left eye: No discharge.     Extraocular Movements: Extraocular movements intact.     Conjunctiva/sclera: Conjunctivae normal.     Pupils: Pupils are equal, round, and reactive to light.  Skin:    Capillary Refill: Capillary refill takes less than 2 seconds.      UC Treatments / Results  Labs (all labs ordered are listed, but only abnormal results are displayed) Labs Reviewed - No data to display  EKG   Radiology No results found.  Procedures Procedures (including critical care time)  Medications Ordered in UC Medications - No data to display  Initial Impression / Assessment and Plan / UC Course  I have reviewed the triage vital signs and the nursing notes.  Pertinent labs & imaging  results that were available during my care of the patient were reviewed by me and considered in my medical decision making (see chart for details).  Clinical impression: Laceration to the upper lip no 5 days post injury.  Presents for suture removal.  Wound looks good and patient reports no complications.  Treatment plan: 4 interrupted sutures were removed without incident.  No bleeding or infection. Patient will follow-up as needed. Patient was discharged from care in stable condition.    Final Clinical Impressions(s) / UC Diagnoses   Final diagnoses:  Encounter for removal of sutures  Injury of lip, initial encounter     Discharge Instructions     Please see educational handouts.    ED Prescriptions    None     PDMP not reviewed this encounter.   Verda Cumins, MD 09/10/20 [phone]

## 2021-09-23 IMAGING — CT CT HEAD W/O CM
4 series · 16 of 47 positions shown, 18 images · non-contrast
Comparison: None.

CLINICAL DATA: Posttraumatic headache.

EXAM:
CT HEAD WITHOUT CONTRAST
CT MAXILLOFACIAL WITHOUT CONTRAST
TECHNIQUE: Multidetector CT imaging of the head and maxillofacial structures
were performed using the standard protocol without intravenous
contrast. Multiplanar CT image reconstructions of the maxillofacial
structures were also generated.

[Series 2: head bone · axial · 0.40mm/px · z∈[-76,-48]mm · 3 of 74 slices shown]
[im 8/74  bone]
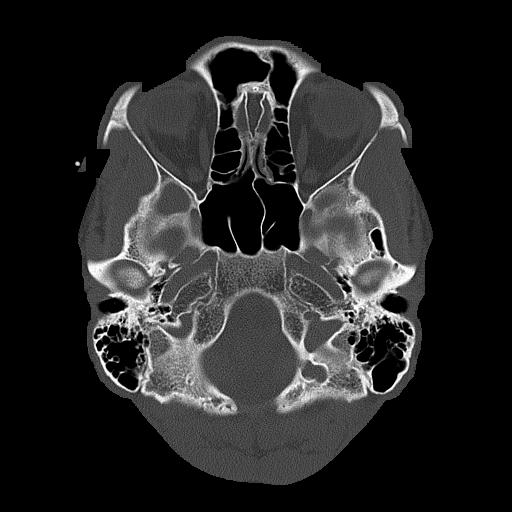
[im 15/74  bone]
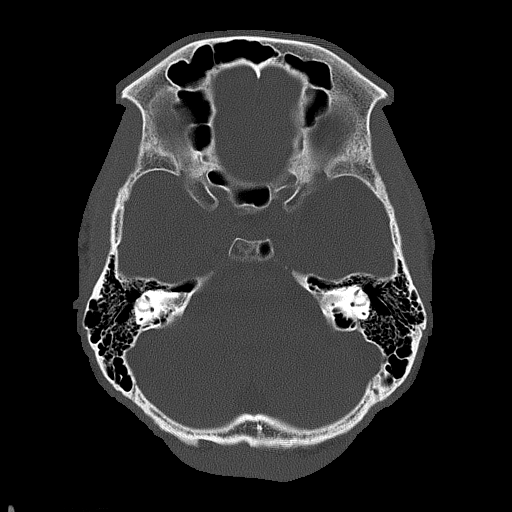
[im 22/74  bone]
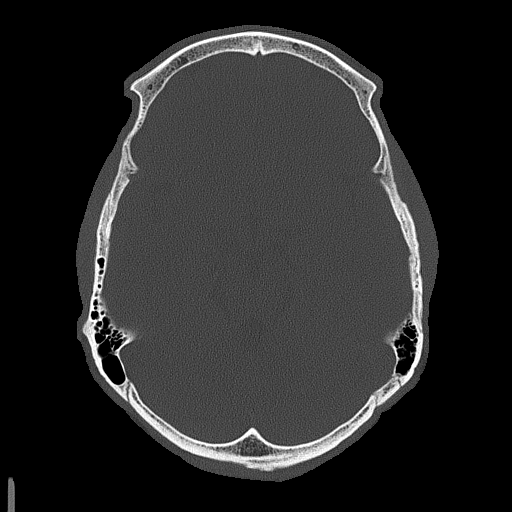

[Series 3: head wo · axial · 0.40mm/px · z∈[-75,+35]mm · 7 of 30 slices shown, 9 images]
[im 4/30  brain]
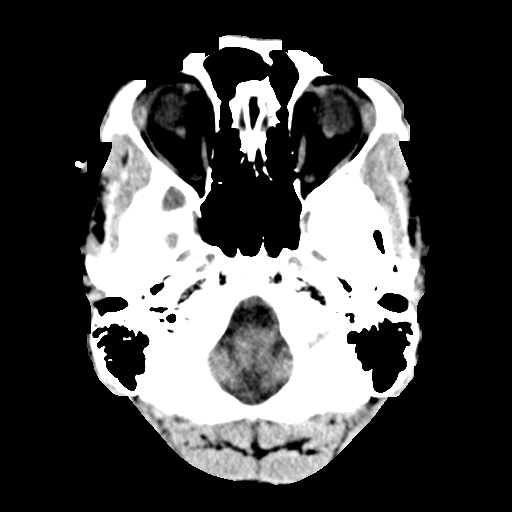
[im 4/30  bone]
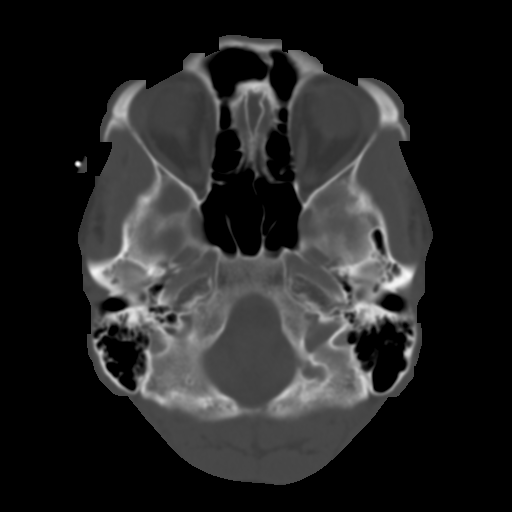
[im 8/30  brain]
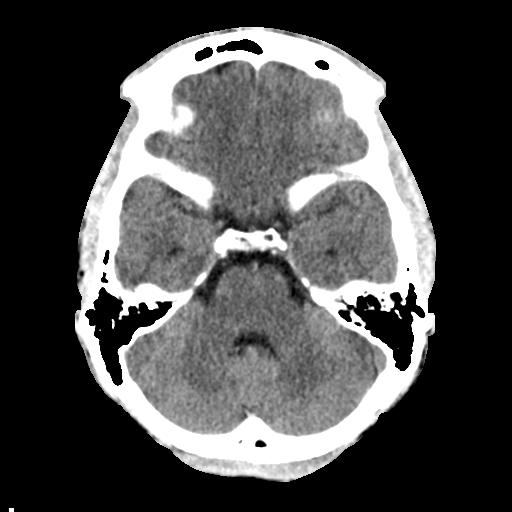
[im 11/30  brain]
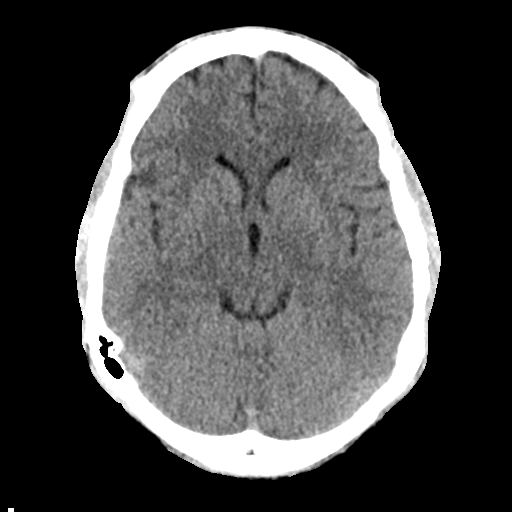
[im 15/30  brain]
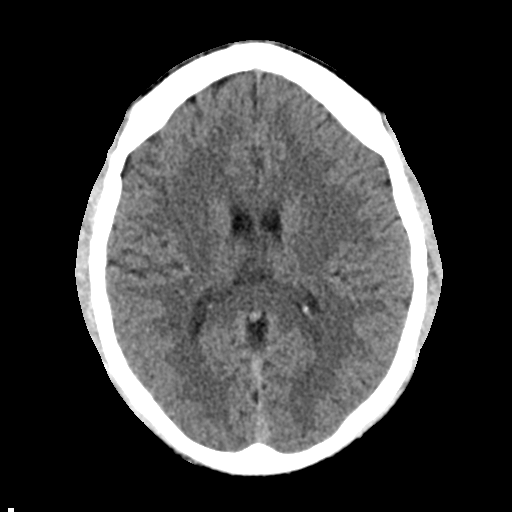
[im 19/30  brain]
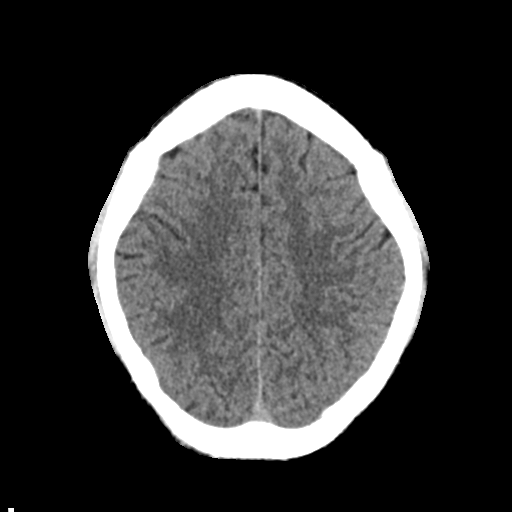
[im 19/30  bone]
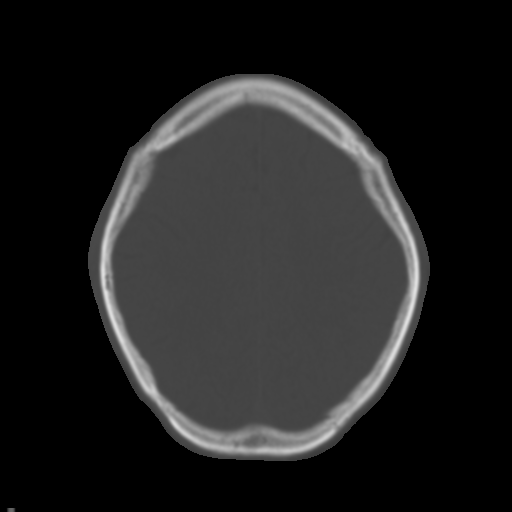
[im 22/30  brain]
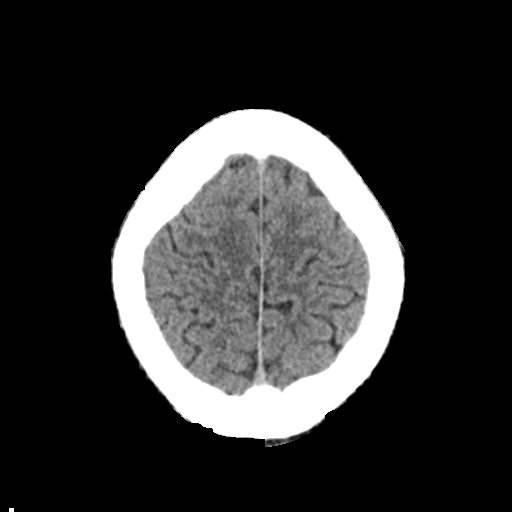
[im 26/30  brain]
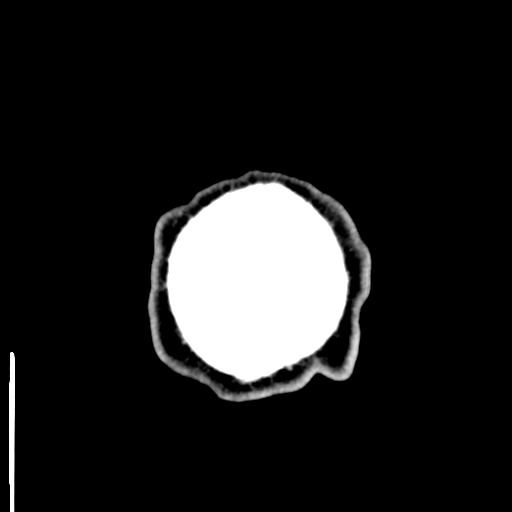

[Series 4: coronal soft tissue · coronal · 0.29mm/px · 3 of 65 slices shown]
[im 22/65  brain]
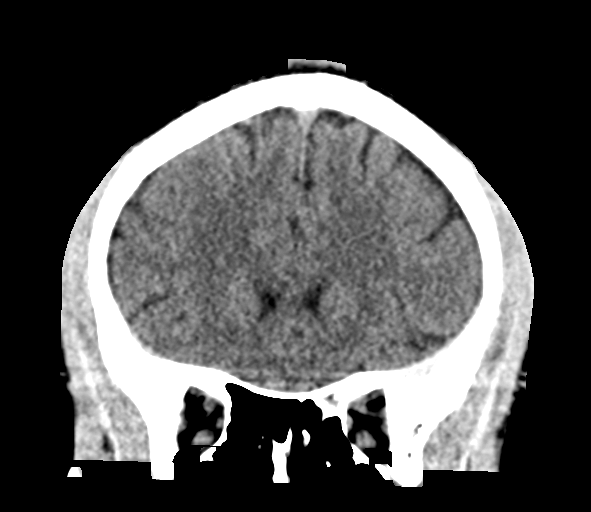
[im 29/65  brain]
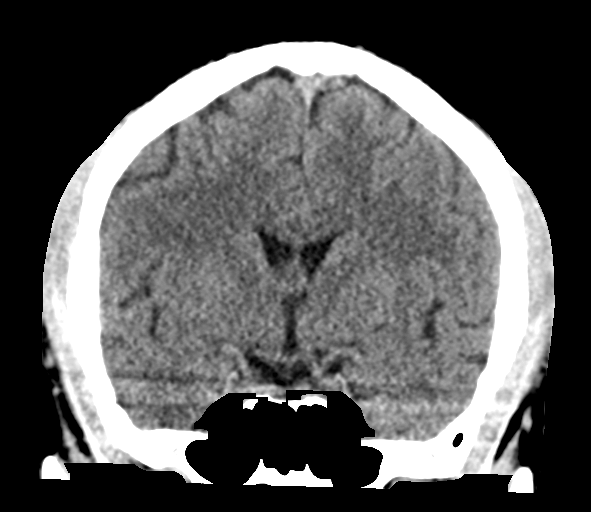
[im 36/65  brain]
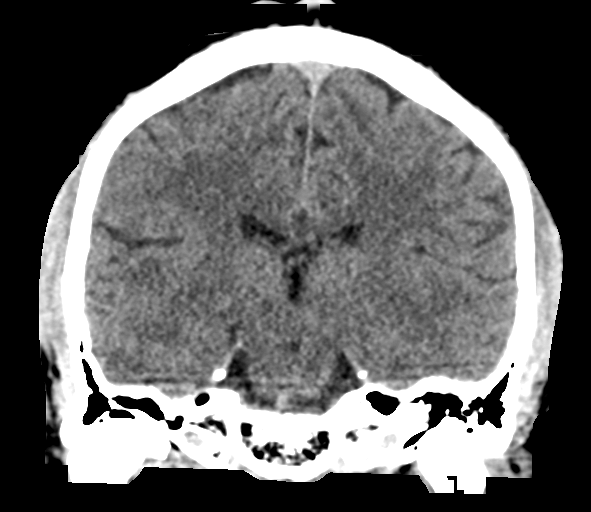

[Series 5: sagittal soft tissue · sagittal · 0.29mm/px · 3 of 56 slices shown]
[im 19/56  brain]
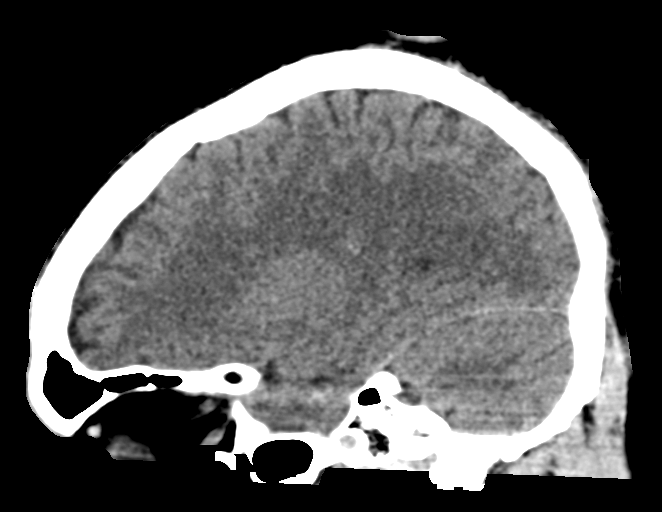
[im 28/56  brain]
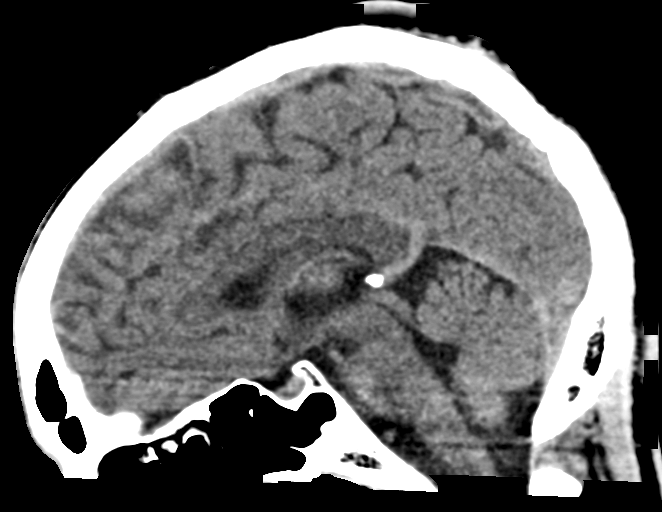
[im 37/56  brain]
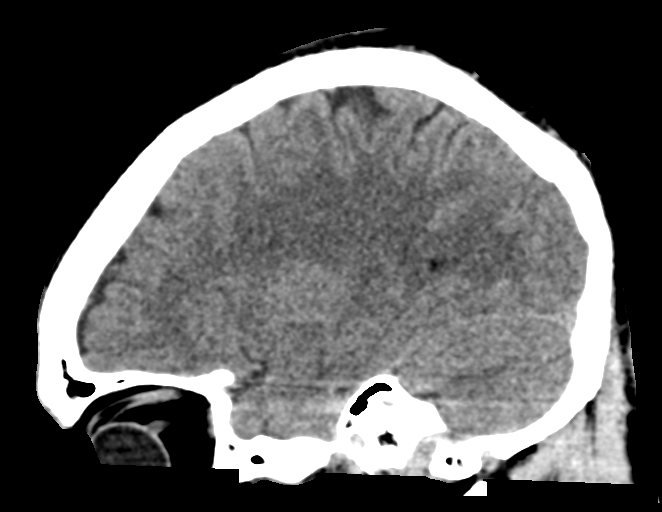

[16 of 47 positions shown; findings below may reference images not displayed]

FINDINGS: CT HEAD FINDINGS

Brain: There is no mass, hemorrhage or extra-axial collection. The
size and configuration of the ventricles and extra-axial CSF spaces
are normal. The brain parenchyma is normal, without evidence of
acute or chronic infarction.

Vascular: No hyperdense vessel or unexpected vascular calcification.

Skull: The visualized skull base, calvarium and extracranial soft
tissues are normal.

CT MAXILLOFACIAL FINDINGS

Osseous:

--Complex facial fracture types: No [place], zygomaticomaxillary
complex or nasoorbitoethmoidal fracture.

--Simple fracture types: None.

--Mandible, hard palate and teeth: No acute abnormality.

Orbits: The globes and optic nerves are intact. Normal extraocular
muscles and intraorbital fat.

Sinuses: No acute finding.

Soft tissues: Normal visualized extracranial soft tissues.
IMPRESSION: 1. No acute intracranial abnormality.
2. No facial fracture.

## 2022-06-04 ENCOUNTER — Other Ambulatory Visit: Payer: Self-pay

## 2022-06-04 ENCOUNTER — Emergency Department
Admission: EM | Admit: 2022-06-04 | Discharge: 2022-06-04 | Disposition: A | Discharge status: Home / Self Care | Payer: Medicaid Other | Attending: Emergency Medicine | Admitting: Emergency Medicine

## 2022-06-04 DIAGNOSIS — J111 Influenza due to unidentified influenza virus with other respiratory manifestations: Secondary | ICD-10-CM

## 2022-06-04 DIAGNOSIS — Z20822 Contact with and (suspected) exposure to covid-19: Secondary | ICD-10-CM | POA: Diagnosis not present

## 2022-06-04 DIAGNOSIS — J101 Influenza due to other identified influenza virus with other respiratory manifestations: Secondary | ICD-10-CM | POA: Insufficient documentation

## 2022-06-04 DIAGNOSIS — R509 Fever, unspecified: Secondary | ICD-10-CM | POA: Diagnosis present

## 2022-06-04 LAB — RESP PANEL BY RT-PCR (RSV, FLU A&B, COVID)  RVPGX2
Influenza A by PCR: POSITIVE — AB
Influenza B by PCR: NEGATIVE
Resp Syncytial Virus by PCR: NEGATIVE
SARS Coronavirus 2 by RT PCR: NEGATIVE

## 2022-06-04 MED ORDER — ACETAMINOPHEN 325 MG PO TABS
650.0000 mg | ORAL_TABLET | Freq: Once | ORAL | Status: AC
  Administered 2022-06-04: 650 mg via ORAL
  Filled 2022-06-04: qty 2

## 2022-06-04 NOTE — ED Provider Notes (Signed)
   [facility] Provider Note    Event Date/Time   First MD Initiated Contact with Patient 06/04/22 1038     (approximate)   History   Fever   HPI  Ronnie Garcia is a 27 y.o. male with no significant past medical history presents with complaints of fever, chills, headache, sore throat, body aches, cough     Physical Exam   Triage Vital Signs: ED Triage Vitals  Enc Vitals Group     BP 06/04/22 1106 119/71     Pulse Rate 06/04/22 1106 82     Resp 06/04/22 1106 16     Temp 06/04/22 1106 97.7 F (36.5 C)     Temp Source 06/04/22 1106 Oral     SpO2 06/04/22 1106 97 %     Weight 06/04/22 1031 77.1 kg (169 lb 15.6 oz)     Height 06/04/22 1031 1.88 m ('6\' 2"'$ )     Head Circumference --      Peak Flow --      Pain Score 06/04/22 1031 8     Pain Loc --      Pain Edu? --      Excl. in [place]? --     Most recent vital signs: Vitals:   06/04/22 1106  BP: 119/71  Pulse: 82  Resp: 16  Temp: 97.7 F (36.5 C)  SpO2: 97%     General: Awake, no distress.  CV:  Good peripheral perfusion.  Resp:  Normal effort.  Clear to auscultation bilaterally abd:  No distention.  Other:     ED Results / Procedures / Treatments   Labs (all labs ordered are listed, but only abnormal results are displayed) Labs Reviewed  RESP PANEL BY RT-PCR (RSV, FLU A&B, COVID)  RVPGX2 - Abnormal; Notable for the following components:      Result Value   Influenza A by PCR POSITIVE (*)    All other components within normal limits     EKG     RADIOLOGY     PROCEDURES:  Critical Care performed:   Procedures   MEDICATIONS ORDERED IN ED: Medications  acetaminophen (TYLENOL) tablet 650 mg (650 mg Oral Given 06/04/22 1111)     IMPRESSION / MDM / [place] / ED COURSE  I reviewed the triage vital signs and the nursing notes. Patient's presentation is most consistent with acute illness / injury with system symptoms.  Patient presents with upper respiratory  infection, likely viral.  Respiratory PCR sent, p.o. Tylenol  PCR is positive for flu, no indication for treatment, recommend supportive care, outpatient follow-up        FINAL CLINICAL IMPRESSION(S) / ED DIAGNOSES   Final diagnoses:  Influenza-like illness     Rx / DC Orders   ED Discharge Orders     None        Note:  This document was prepared using Dragon voice recognition software and may include unintentional dictation errors.   Lavonia Drafts, MD 06/04/22 [phone]

## 2022-06-04 NOTE — ED Triage Notes (Signed)
C/o fever today.  Has been feeling tired x 2 days, but fever today.  Reports temperature at home as 104.  Has not medicated for fever.  AAOx3. Skin warm and dry. NAD
